# Patient Record
Sex: Male | Born: 1945 | Race: White | Hispanic: No | Smoking: Former smoker
Health system: Southern US, Community
[De-identification: ages and names within clinical notes are randomized; demographics above are authoritative.]

## PROBLEM LIST (undated history)

## (undated) DIAGNOSIS — Z8521 Personal history of malignant neoplasm of larynx: Secondary | ICD-10-CM

## (undated) DIAGNOSIS — C3490 Malignant neoplasm of unspecified part of unspecified bronchus or lung: Secondary | ICD-10-CM

## (undated) DIAGNOSIS — I10 Essential (primary) hypertension: Secondary | ICD-10-CM

## (undated) DIAGNOSIS — C159 Malignant neoplasm of esophagus, unspecified: Secondary | ICD-10-CM

## (undated) DIAGNOSIS — M4800 Spinal stenosis, site unspecified: Secondary | ICD-10-CM

## (undated) HISTORY — PX: TOTAL LARYNGECTOMY: SHX2543

---

## 2012-02-19 DIAGNOSIS — I2510 Atherosclerotic heart disease of native coronary artery without angina pectoris: Secondary | ICD-10-CM

## 2012-03-04 DIAGNOSIS — I2119 ST elevation (STEMI) myocardial infarction involving other coronary artery of inferior wall: Secondary | ICD-10-CM

## 2012-03-04 DIAGNOSIS — I2510 Atherosclerotic heart disease of native coronary artery without angina pectoris: Secondary | ICD-10-CM

## 2012-03-04 HISTORY — PX: LEFT HEART CATH AND CORONARY ANGIOGRAPHY: CATH118249

## 2012-03-04 HISTORY — DX: Atherosclerotic heart disease of native coronary artery without angina pectoris: I25.10

## 2012-03-04 HISTORY — DX: ST elevation (STEMI) myocardial infarction involving other coronary artery of inferior wall: I21.19

## 2012-03-04 HISTORY — PX: CORONARY STENT PLACEMENT: SHX1402

## 2015-02-02 HISTORY — PX: LUNG LOBECTOMY: SHX167

## 2020-01-22 HISTORY — PX: TRANSTHORACIC ECHOCARDIOGRAM: SHX275

## 2020-12-18 ENCOUNTER — Emergency Department (HOSPITAL_COMMUNITY): Payer: Medicare Other

## 2020-12-18 ENCOUNTER — Inpatient Hospital Stay (HOSPITAL_COMMUNITY)
Admission: EM | Admit: 2020-12-18 | Discharge: 2020-12-25 | DRG: 193 | Disposition: A | Discharge status: Home Health | Payer: Medicare Other | Attending: Internal Medicine | Admitting: Internal Medicine

## 2020-12-18 ENCOUNTER — Emergency Department (HOSPITAL_COMMUNITY)
Admission: EM | Admit: 2020-12-18 | Discharge: 2020-12-18 | Discharge status: Absent without leave | Payer: Medicare Other | Source: Home / Self Care | Attending: Emergency Medicine | Admitting: Emergency Medicine

## 2020-12-18 ENCOUNTER — Other Ambulatory Visit: Payer: Self-pay

## 2020-12-18 ENCOUNTER — Encounter (HOSPITAL_COMMUNITY): Payer: Self-pay | Admitting: Student

## 2020-12-18 ENCOUNTER — Inpatient Hospital Stay (HOSPITAL_COMMUNITY): Payer: Medicare Other

## 2020-12-18 DIAGNOSIS — F32A Depression, unspecified: Secondary | ICD-10-CM | POA: Diagnosis present

## 2020-12-18 DIAGNOSIS — M4800 Spinal stenosis, site unspecified: Secondary | ICD-10-CM | POA: Diagnosis present

## 2020-12-18 DIAGNOSIS — Z7189 Other specified counseling: Secondary | ICD-10-CM | POA: Diagnosis not present

## 2020-12-18 DIAGNOSIS — I498 Other specified cardiac arrhythmias: Secondary | ICD-10-CM | POA: Diagnosis present

## 2020-12-18 DIAGNOSIS — K5900 Constipation, unspecified: Secondary | ICD-10-CM | POA: Diagnosis present

## 2020-12-18 DIAGNOSIS — Z7982 Long term (current) use of aspirin: Secondary | ICD-10-CM

## 2020-12-18 DIAGNOSIS — Z85118 Personal history of other malignant neoplasm of bronchus and lung: Secondary | ICD-10-CM

## 2020-12-18 DIAGNOSIS — F0390 Unspecified dementia without behavioral disturbance: Secondary | ICD-10-CM | POA: Diagnosis present

## 2020-12-18 DIAGNOSIS — I2510 Atherosclerotic heart disease of native coronary artery without angina pectoris: Secondary | ICD-10-CM | POA: Diagnosis present

## 2020-12-18 DIAGNOSIS — H57811 Brow ptosis, right: Secondary | ICD-10-CM | POA: Diagnosis present

## 2020-12-18 DIAGNOSIS — R54 Age-related physical debility: Secondary | ICD-10-CM | POA: Diagnosis present

## 2020-12-18 DIAGNOSIS — Z20822 Contact with and (suspected) exposure to covid-19: Secondary | ICD-10-CM | POA: Diagnosis present

## 2020-12-18 DIAGNOSIS — G9341 Metabolic encephalopathy: Secondary | ICD-10-CM | POA: Diagnosis present

## 2020-12-18 DIAGNOSIS — Z79890 Hormone replacement therapy (postmenopausal): Secondary | ICD-10-CM

## 2020-12-18 DIAGNOSIS — Z79891 Long term (current) use of opiate analgesic: Secondary | ICD-10-CM

## 2020-12-18 DIAGNOSIS — L89152 Pressure ulcer of sacral region, stage 2: Secondary | ICD-10-CM | POA: Diagnosis present

## 2020-12-18 DIAGNOSIS — Z79899 Other long term (current) drug therapy: Secondary | ICD-10-CM

## 2020-12-18 DIAGNOSIS — N179 Acute kidney failure, unspecified: Secondary | ICD-10-CM

## 2020-12-18 DIAGNOSIS — Z87891 Personal history of nicotine dependence: Secondary | ICD-10-CM

## 2020-12-18 DIAGNOSIS — J9601 Acute respiratory failure with hypoxia: Secondary | ICD-10-CM | POA: Diagnosis present

## 2020-12-18 DIAGNOSIS — G8929 Other chronic pain: Secondary | ICD-10-CM | POA: Diagnosis present

## 2020-12-18 DIAGNOSIS — Z9002 Acquired absence of larynx: Secondary | ICD-10-CM

## 2020-12-18 DIAGNOSIS — I491 Atrial premature depolarization: Secondary | ICD-10-CM | POA: Diagnosis present

## 2020-12-18 DIAGNOSIS — Z8501 Personal history of malignant neoplasm of esophagus: Secondary | ICD-10-CM

## 2020-12-18 DIAGNOSIS — J189 Pneumonia, unspecified organism: Principal | ICD-10-CM

## 2020-12-18 DIAGNOSIS — E785 Hyperlipidemia, unspecified: Secondary | ICD-10-CM | POA: Diagnosis present

## 2020-12-18 DIAGNOSIS — Z515 Encounter for palliative care: Secondary | ICD-10-CM | POA: Diagnosis not present

## 2020-12-18 DIAGNOSIS — J439 Emphysema, unspecified: Secondary | ICD-10-CM | POA: Diagnosis present

## 2020-12-18 DIAGNOSIS — E860 Dehydration: Secondary | ICD-10-CM | POA: Diagnosis present

## 2020-12-18 DIAGNOSIS — I455 Other specified heart block: Secondary | ICD-10-CM | POA: Diagnosis not present

## 2020-12-18 DIAGNOSIS — I252 Old myocardial infarction: Secondary | ICD-10-CM

## 2020-12-18 DIAGNOSIS — N183 Chronic kidney disease, stage 3 unspecified: Secondary | ICD-10-CM | POA: Diagnosis present

## 2020-12-18 DIAGNOSIS — Z902 Acquired absence of lung [part of]: Secondary | ICD-10-CM

## 2020-12-18 DIAGNOSIS — Z923 Personal history of irradiation: Secondary | ICD-10-CM

## 2020-12-18 DIAGNOSIS — Z888 Allergy status to other drugs, medicaments and biological substances status: Secondary | ICD-10-CM

## 2020-12-18 DIAGNOSIS — Z930 Tracheostomy status: Secondary | ICD-10-CM

## 2020-12-18 DIAGNOSIS — R64 Cachexia: Secondary | ICD-10-CM | POA: Diagnosis present

## 2020-12-18 DIAGNOSIS — Z8521 Personal history of malignant neoplasm of larynx: Secondary | ICD-10-CM

## 2020-12-18 DIAGNOSIS — I5032 Chronic diastolic (congestive) heart failure: Secondary | ICD-10-CM | POA: Diagnosis present

## 2020-12-18 DIAGNOSIS — Z881 Allergy status to other antibiotic agents status: Secondary | ICD-10-CM

## 2020-12-18 DIAGNOSIS — G4700 Insomnia, unspecified: Secondary | ICD-10-CM | POA: Diagnosis present

## 2020-12-18 DIAGNOSIS — I130 Hypertensive heart and chronic kidney disease with heart failure and stage 1 through stage 4 chronic kidney disease, or unspecified chronic kidney disease: Secondary | ICD-10-CM | POA: Diagnosis present

## 2020-12-18 DIAGNOSIS — E440 Moderate protein-calorie malnutrition: Secondary | ICD-10-CM | POA: Diagnosis present

## 2020-12-18 DIAGNOSIS — L8990 Pressure ulcer of unspecified site, unspecified stage: Secondary | ICD-10-CM | POA: Insufficient documentation

## 2020-12-18 DIAGNOSIS — Z880 Allergy status to penicillin: Secondary | ICD-10-CM

## 2020-12-18 DIAGNOSIS — R531 Weakness: Secondary | ICD-10-CM | POA: Diagnosis not present

## 2020-12-18 DIAGNOSIS — Z7401 Bed confinement status: Secondary | ICD-10-CM

## 2020-12-18 DIAGNOSIS — Z6821 Body mass index (BMI) 21.0-21.9, adult: Secondary | ICD-10-CM

## 2020-12-18 DIAGNOSIS — Z955 Presence of coronary angioplasty implant and graft: Secondary | ICD-10-CM

## 2020-12-18 HISTORY — DX: Malignant neoplasm of unspecified part of unspecified bronchus or lung: C34.90

## 2020-12-18 HISTORY — DX: Personal history of malignant neoplasm of larynx: Z85.21

## 2020-12-18 HISTORY — DX: Malignant neoplasm of esophagus, unspecified: C15.9

## 2020-12-18 HISTORY — DX: Spinal stenosis, site unspecified: M48.00

## 2020-12-18 HISTORY — DX: Essential (primary) hypertension: I10

## 2020-12-18 LAB — CBC WITH DIFFERENTIAL/PLATELET
Abs Immature Granulocytes: 1 10*3/uL — ABNORMAL HIGH (ref 0.00–0.07)
Band Neutrophils: 21 %
Basophils Absolute: 0 10*3/uL (ref 0.0–0.1)
Basophils Relative: 0 %
Eosinophils Absolute: 0 10*3/uL (ref 0.0–0.5)
Eosinophils Relative: 0 %
HCT: 35.7 % — ABNORMAL LOW (ref 39.0–52.0)
Hemoglobin: 11.5 g/dL — ABNORMAL LOW (ref 13.0–17.0)
Lymphocytes Relative: 2 %
Lymphs Abs: 0.7 10*3/uL (ref 0.7–4.0)
MCH: 28 pg (ref 26.0–34.0)
MCHC: 32.2 g/dL (ref 30.0–36.0)
MCV: 87.1 fL (ref 80.0–100.0)
Metamyelocytes Relative: 1 %
Monocytes Absolute: 0.7 10*3/uL (ref 0.1–1.0)
Monocytes Relative: 2 %
Myelocytes: 2 %
Neutro Abs: 30.7 10*3/uL — ABNORMAL HIGH (ref 1.7–7.7)
Neutrophils Relative %: 72 %
Platelets: 236 10*3/uL (ref 150–400)
RBC: 4.1 MIL/uL — ABNORMAL LOW (ref 4.22–5.81)
RDW: 15.8 % — ABNORMAL HIGH (ref 11.5–15.5)
WBC Morphology: INCREASED
WBC: 33 10*3/uL — ABNORMAL HIGH (ref 4.0–10.5)
nRBC: 0 % (ref 0.0–0.2)

## 2020-12-18 LAB — COMPREHENSIVE METABOLIC PANEL
ALT: 13 U/L (ref 0–44)
AST: 18 U/L (ref 15–41)
Albumin: 3 g/dL — ABNORMAL LOW (ref 3.5–5.0)
Alkaline Phosphatase: 97 U/L (ref 38–126)
Anion gap: 12 (ref 5–15)
BUN: 64 mg/dL — ABNORMAL HIGH (ref 8–23)
CO2: 24 mmol/L (ref 22–32)
Calcium: 8.4 mg/dL — ABNORMAL LOW (ref 8.9–10.3)
Chloride: 99 mmol/L (ref 98–111)
Creatinine, Ser: 1.65 mg/dL — ABNORMAL HIGH (ref 0.61–1.24)
GFR, Estimated: 43 mL/min — ABNORMAL LOW (ref >60)
Glucose, Bld: 108 mg/dL — ABNORMAL HIGH (ref 70–99)
Potassium: 4.5 mmol/L (ref 3.5–5.1)
Sodium: 135 mmol/L (ref 135–145)
Total Bilirubin: 1.6 mg/dL — ABNORMAL HIGH (ref 0.3–1.2)
Total Protein: 7.6 g/dL (ref 6.5–8.1)

## 2020-12-18 LAB — URINALYSIS, ROUTINE W REFLEX MICROSCOPIC

## 2020-12-18 LAB — CBC
HCT: 34.1 % — ABNORMAL LOW (ref 39.0–52.0)
Hemoglobin: 10.9 g/dL — ABNORMAL LOW (ref 13.0–17.0)
MCH: 28.2 pg (ref 26.0–34.0)
MCHC: 32 g/dL (ref 30.0–36.0)
MCV: 88.3 fL (ref 80.0–100.0)
Platelets: 227 10*3/uL (ref 150–400)
RBC: 3.86 MIL/uL — ABNORMAL LOW (ref 4.22–5.81)
RDW: 15.9 % — ABNORMAL HIGH (ref 11.5–15.5)
WBC: 25.7 10*3/uL — ABNORMAL HIGH (ref 4.0–10.5)
nRBC: 0 % (ref 0.0–0.2)

## 2020-12-18 LAB — PROTIME-INR
INR: 1.3 — ABNORMAL HIGH (ref 0.8–1.2)
Prothrombin Time: 15.4 seconds — ABNORMAL HIGH (ref 11.4–15.2)

## 2020-12-18 LAB — LACTIC ACID, PLASMA
Lactic Acid, Venous: 1.9 mmol/L (ref 0.5–1.9)
Lactic Acid, Venous: 1.7 mmol/L (ref 0.5–1.9)

## 2020-12-18 LAB — RESP PANEL BY RT-PCR (FLU A&B, COVID) ARPGX2
Influenza A by PCR: NEGATIVE
Influenza B by PCR: NEGATIVE
SARS Coronavirus 2 by RT PCR: NEGATIVE

## 2020-12-18 LAB — CREATININE, SERUM
Creatinine, Ser: 1.1 mg/dL (ref 0.61–1.24)
GFR, Estimated: >60 mL/min (ref >60)

## 2020-12-18 LAB — APTT: aPTT: 40 seconds — ABNORMAL HIGH (ref 24–36)

## 2020-12-18 MED ORDER — POLYETHYLENE GLYCOL 3350 17 G PO PACK: 17.0000 g | PACK | Freq: Every day | ORAL | Status: DC | PRN

## 2020-12-18 MED ORDER — ENSURE ENLIVE PO LIQD
237.0000 mL | Freq: Two times a day (BID) | ORAL | Status: DC
  Administered 2020-12-19 – 2020-12-25 (×12): 237 mL via ORAL

## 2020-12-18 MED ORDER — LACTATED RINGERS IV SOLN: INTRAVENOUS | Status: DC

## 2020-12-18 MED ORDER — GABAPENTIN 300 MG PO CAPS
300.0000 mg | ORAL_CAPSULE | Freq: Three times a day (TID) | ORAL | Status: DC
  Administered 2020-12-18 – 2020-12-25 (×22): 300 mg via ORAL
  Filled 2020-12-18 (×22): qty 1

## 2020-12-18 MED ORDER — ONDANSETRON HCL 4 MG PO TABS: 4.0000 mg | ORAL_TABLET | Freq: Four times a day (QID) | ORAL | Status: DC | PRN

## 2020-12-18 MED ORDER — SODIUM CHLORIDE 0.9 % IV SOLN
500.0000 mg | INTRAVENOUS | Status: AC
  Administered 2020-12-18 – 2020-12-24 (×7): 500 mg via INTRAVENOUS
  Filled 2020-12-18 (×3): qty 500
  Filled 2020-12-18: qty 166.67
  Filled 2020-12-18 (×2): qty 500

## 2020-12-18 MED ORDER — SODIUM CHLORIDE 0.9 % IV SOLN
500.0000 mg | INTRAVENOUS | Status: DC
  Filled 2020-12-18: qty 500

## 2020-12-18 MED ORDER — ONDANSETRON HCL 4 MG/2ML IJ SOLN: 4.0000 mg | Freq: Four times a day (QID) | INTRAMUSCULAR | Status: DC | PRN

## 2020-12-18 MED ORDER — SODIUM CHLORIDE 0.9 % IV SOLN
2.0000 g | INTRAVENOUS | Status: AC
  Administered 2020-12-18 – 2020-12-24 (×7): 2 g via INTRAVENOUS
  Filled 2020-12-18 (×5): qty 2
  Filled 2020-12-18: qty 20

## 2020-12-18 MED ORDER — GUAIFENESIN ER 600 MG PO TB12
600.0000 mg | ORAL_TABLET | Freq: Two times a day (BID) | ORAL | Status: DC
  Administered 2020-12-18 – 2020-12-25 (×14): 600 mg via ORAL
  Filled 2020-12-18 (×15): qty 1

## 2020-12-18 MED ORDER — BISACODYL 10 MG RE SUPP: 10.0000 mg | Freq: Every day | RECTAL | Status: DC | PRN

## 2020-12-18 MED ORDER — GABAPENTIN 300 MG PO CAPS
300.0000 mg | ORAL_CAPSULE | Freq: Four times a day (QID) | ORAL | Status: DC
Start: 2020-12-18 — End: 2020-12-18
  Administered 2020-12-18: 300 mg via ORAL
  Filled 2020-12-18: qty 1

## 2020-12-18 MED ORDER — LEVOTHYROXINE SODIUM 88 MCG PO TABS
88.0000 ug | ORAL_TABLET | Freq: Every day | ORAL | Status: DC
  Administered 2020-12-18 – 2020-12-25 (×8): 88 ug via ORAL
  Filled 2020-12-18 (×8): qty 1

## 2020-12-18 MED ORDER — SENNA 8.6 MG PO TABS
1.0000 | ORAL_TABLET | Freq: Two times a day (BID) | ORAL | Status: DC
  Administered 2020-12-18 – 2020-12-25 (×11): 8.6 mg via ORAL
  Filled 2020-12-18 (×14): qty 1

## 2020-12-18 MED ORDER — ASPIRIN EC 81 MG PO TBEC
81.0000 mg | DELAYED_RELEASE_TABLET | Freq: Every day | ORAL | Status: DC
  Administered 2020-12-19 – 2020-12-25 (×7): 81 mg via ORAL
  Filled 2020-12-18 (×7): qty 1

## 2020-12-18 MED ORDER — DOCUSATE SODIUM 100 MG PO CAPS
100.0000 mg | ORAL_CAPSULE | Freq: Two times a day (BID) | ORAL | Status: DC
  Administered 2020-12-19 – 2020-12-25 (×10): 100 mg via ORAL
  Filled 2020-12-18 (×13): qty 1

## 2020-12-18 MED ORDER — LACTATED RINGERS IV BOLUS: 1000.0000 mL | Freq: Once | INTRAVENOUS | Status: DC

## 2020-12-18 MED ORDER — METHADONE HCL 5 MG PO TABS
5.0000 mg | ORAL_TABLET | Freq: Four times a day (QID) | ORAL | Status: DC
  Administered 2020-12-18 – 2020-12-25 (×28): 5 mg via ORAL
  Filled 2020-12-18 (×30): qty 1

## 2020-12-18 MED ORDER — SODIUM CHLORIDE 0.9 % IV SOLN
2.0000 g | INTRAVENOUS | Status: DC
  Filled 2020-12-18: qty 20

## 2020-12-18 MED ORDER — VITAMIN B-12 1000 MCG PO TABS
1000.0000 ug | ORAL_TABLET | Freq: Every day | ORAL | Status: DC
  Administered 2020-12-19 – 2020-12-25 (×7): 1000 ug via ORAL
  Filled 2020-12-18 (×7): qty 1

## 2020-12-18 MED ORDER — ALBUTEROL SULFATE (2.5 MG/3ML) 0.083% IN NEBU: 2.5000 mg | INHALATION_SOLUTION | Freq: Four times a day (QID) | RESPIRATORY_TRACT | Status: DC | PRN

## 2020-12-18 MED ORDER — ENOXAPARIN SODIUM 40 MG/0.4ML ~~LOC~~ SOLN
40.0000 mg | SUBCUTANEOUS | Status: DC
  Administered 2020-12-18 – 2020-12-24 (×7): 40 mg via SUBCUTANEOUS
  Filled 2020-12-18 (×7): qty 0.4

## 2020-12-18 MED ORDER — ACETAMINOPHEN 325 MG PO TABS
650.0000 mg | ORAL_TABLET | Freq: Four times a day (QID) | ORAL | Status: DC | PRN
  Administered 2020-12-19 – 2020-12-25 (×10): 650 mg via ORAL
  Filled 2020-12-18 (×11): qty 2

## 2020-12-18 MED ORDER — DULOXETINE HCL 20 MG PO CPEP
20.0000 mg | ORAL_CAPSULE | Freq: Every day | ORAL | Status: DC
  Administered 2020-12-19 – 2020-12-25 (×7): 20 mg via ORAL
  Filled 2020-12-18 (×7): qty 1

## 2020-12-18 MED ORDER — LACTATED RINGERS IV SOLN: INTRAVENOUS | Status: AC

## 2020-12-18 MED ORDER — ACETAMINOPHEN 650 MG RE SUPP: 650.0000 mg | Freq: Four times a day (QID) | RECTAL | Status: DC | PRN

## 2020-12-18 MED ORDER — LACTATED RINGERS IV BOLUS
1000.0000 mL | Freq: Once | INTRAVENOUS | Status: AC
  Administered 2020-12-18: 1000 mL via INTRAVENOUS

## 2020-12-18 NOTE — ED Triage Notes (Addendum)
Pt arrives EMS from home with suspected urinary tract infection and shob. Pt oxygen dropped to 88% ra. EMS placed NRB 15L on stoma. Hx lung cancer.

## 2020-12-18 NOTE — ED Triage Notes (Signed)
Pt arrives EMS from home with suspected urinary tract infection and shob. Pt oxygen dropped to 88% ra. EMS placed NRB 15L on stoma. Hx lung cancer.

## 2020-12-18 NOTE — ED Notes (Signed)
Patient attached to external condom catheter

## 2020-12-18 NOTE — ED Provider Notes (Signed)
[facility] DEPT Provider Note   CSN: [id number] Arrival date & time: 12/18/20  0428     History Chief Complaint  Patient presents with  . Fall  . Weakness    Ryan Miles is a 75 y.o. male.with a hx of hypertension, spinal stenosis, CAD, lung cancer & esophageal cancer who presents to the ED via EMS for evaluation of fall tonight and concern for UTI.  History initially provided by EMS who relayed that they received a call that the patient had a fall from his bed and there was concern for UTI, EMS did not believe there was any injury from fall, they also note that patient was desaturating en route, he is noted to be bed bound.  Ultimately patient's daughter arrived shortly following his arrival-she states that over the past few days he is seemed more confused intermittently as well as had increased weakness especially to the upper extremities bilaterally.  He is requesting to urinate frequently but then does not need to urinate.  An occupational therapist that came to the house told him his symptoms may be from a UTI.  Tonight patient's daughter heard a loud thump noise, she found the patient on the ground, she thinks he may have hit his head but does not think that he had loss of consciousness.  Patient intermittently more confused per family, having some difficulty with communication as he has significant hoarseness S/p esophageal cancer, level 5 caveat applies. Patient had prior electrolarynx in place however this was removed.   HPI     Past Medical History:  Diagnosis Date  . Esophageal cancer ([place])   . Hypertension   . Lung cancer ([place])   . Spinal stenosis     There are no problems to display for this patient.   History reviewed. No pertinent surgical history.     History reviewed. No pertinent family history.     Home Medications Prior to Admission medications   Not on File    Allergies    Patient has no allergy information on  record.  Review of Systems   Review of Systems Review of Systems  Unable to perform ROS: Mental status change (& significant hoarseness limiting communication)   Physical Exam Updated Vital Signs BP (!) 113/58 (BP Location: Right Arm)   Pulse 92   Temp 98.2 F (36.8 C) (Oral)   Resp 18   SpO2 96%   Physical Exam Vitals and nursing note reviewed. Exam conducted with a chaperone present.  Constitutional:      Comments: Chronically ill appearing.   HENT:     Head: Normocephalic and atraumatic.     Comments: No racoon eyes or battle sign.     Mouth/Throat:     Mouth: Mucous membranes are dry.  Eyes:     Pupils: Pupils are equal, round, and reactive to light.  Neck:     Comments: Stoma present with some yellow secretions externally.  Cardiovascular:     Rate and Rhythm: Normal rate and regular rhythm.  Pulmonary:     Effort: No respiratory distress.     Comments: SpO2 85-95% on RA, supplemental O2 applied with improvement. Course breath sounds at the bases- R>L.  Abdominal:     General: There is no distension.     Palpations: Abdomen is soft.     Tenderness: There is no abdominal tenderness. There is no guarding or rebound.  Genitourinary:    Comments: sacral ulcer present, no significant skin break down,  mild Soft brown stool present. No melena.  Musculoskeletal:     Cervical back: Neck supple. No tenderness.     Comments: No obvious deformities or significant open wounds.  Passively ranged & palpated throughout the bilateral upper and lower extremities without areas of elicited discomfort. Diffuse midline spinal tenderness to the thoracic/lumbar region without palpable step off.   Skin:    General: Skin is warm and dry.     Comments: Warm to the touch.   Neurological:     Mental Status: He is alert.     Comments: Alert.     ED Results / Procedures / Treatments   Labs (all labs ordered are listed, but only abnormal results are displayed) Labs Reviewed  URINALYSIS,  ROUTINE W REFLEX MICROSCOPIC - Abnormal; Notable for the following components:      Result Value   Color, Urine AMBER (*)    Bilirubin Urine SMALL (*)    Ketones, ur 5 (*)    All other components within normal limits  COMPREHENSIVE METABOLIC PANEL - Abnormal; Notable for the following components:   Glucose, Bld 101 (*)    BUN 63 (*)    Creatinine, Ser 1.50 (*)    Calcium 8.5 (*)    Albumin 3.1 (*)    Total Bilirubin 1.5 (*)    GFR, Estimated 49 (*)    All other components within normal limits  CBC WITH DIFFERENTIAL/PLATELET - Abnormal; Notable for the following components:   WBC 31.9 (*)    RBC 4.17 (*)    Hemoglobin 11.8 (*)    HCT 36.4 (*)    RDW 15.7 (*)    All other components within normal limits  RESP PANEL BY RT-PCR (FLU A&B, COVID) [id number]  URINE CULTURE  CULTURE, BLOOD (ROUTINE X 2)  CULTURE, BLOOD (ROUTINE X 2)  LACTIC ACID, PLASMA  LACTIC ACID, PLASMA  PROTIME-INR  APTT    EKG EKG Interpretation  Date/Time:  Thursday December 18 2020 04:51:15 EDT Ventricular Rate:  85 PR Interval:    QRS Duration: 114 QT Interval:  389 QTC Calculation: 463 R Axis:   79 Text Interpretation: Sinus rhythm Incomplete left bundle branch block No prior ecg for comparison Confirmed by Veryl Speak [phone]) on 12/18/2020 5:22:22 AM   Radiology DG Chest 2 View  Result Date: 12/18/2020 CLINICAL DATA:  Dyspnea.  Fall. EXAM: CHEST - 2 VIEW COMPARISON:  12/13/2014 FINDINGS: Opacity at both lung bases. No Kerley lines, effusion, or pneumothorax seen. Normal heart size and aortic contours. Surgical clips overlap the thoracic inlet. IMPRESSION: Bilateral lower lobe infiltrate. Electronically Signed   By: Monte Fantasia M.D.   On: 12/18/2020 06:19   DG Thoracic Spine 2 View  Result Date: 12/18/2020 CLINICAL DATA:  Dyspnea and fall EXAM: THORACIC SPINE 2 VIEWS COMPARISON:  None. FINDINGS: No evidence of fracture or bone lesion. Mid and lower thoracic disc narrowing and spurring with 2 non  segmented or ankylosed midthoracic vertebrae. No evident bone lesion. Lower lobe infiltrates described on dedicated chest x-ray. IMPRESSION: No acute finding. Electronically Signed   By: Monte Fantasia M.D.   On: 12/18/2020 06:21   DG Lumbar Spine Complete  Result Date: 12/18/2020 CLINICAL DATA:  Fall with dyspnea EXAM: LUMBAR SPINE - COMPLETE 4+ VIEW COMPARISON:  03/14/2017 FINDINGS: Five lumbar type vertebrae Advanced mid and lower lumbar disc degeneration with disc collapse and ridging that is progressed. Degenerative facet spurring in the lower lumbar spine. Mild L3-4 retrolisthesis, also seen on prior. Slight dextroscoliosis. No  overt bone lesion or erosion. Prominent gas within colon and stool at the rectum. Atherosclerosis. IMPRESSION: 1. No acute finding. 2. Advanced lower lumbar disc degeneration that has progressed from 2018. 3. Prominent colonic gas and rectal stool. Electronically Signed   By: Monte Fantasia M.D.   On: 12/18/2020 06:23   DG Pelvis 1-2 Views  Result Date: 12/18/2020 CLINICAL DATA:  Fall with upper and lower back pain. EXAM: PELVIS - 1-2 VIEW COMPARISON:  None. FINDINGS: Rotated pelvis with no evidence of fracture or subluxation. Lower lumbar spine degeneration. Prominent rectosigmoid stool with 10 cm transverse rectal span. The cecum is gas distended to 11 cm. IMPRESSION: 1. No acute finding in the pelvis. 2. Rectosigmoid distention by stool. Electronically Signed   By: Monte Fantasia M.D.   On: 12/18/2020 06:24   CT Head Wo Contrast  Result Date: 12/18/2020 CLINICAL DATA:  Fall with head trauma EXAM: CT HEAD WITHOUT CONTRAST CT CERVICAL SPINE WITHOUT CONTRAST TECHNIQUE: Multidetector CT imaging of the head and cervical spine was performed following the standard protocol without intravenous contrast. Multiplanar CT image reconstructions of the cervical spine were also generated. COMPARISON:  None. FINDINGS: CT HEAD FINDINGS Brain: No evidence of acute infarction,  hemorrhage, hydrocephalus, extra-axial collection or mass lesion/mass effect. Generalized atrophy Vascular: No hyperdense vessel or unexpected calcification. Skull: Normal. Negative for fracture or focal lesion. Sinuses/Orbits: Negative CT CERVICAL SPINE FINDINGS Alignment: Dextroscoliosis. Skull base and vertebrae: No acute fracture Soft tissues and spinal canal: No prevertebral fluid or swelling. No visible canal hematoma. Laryngectomy and neck dissection with transesophageal puncture. Disc levels: Severe facet osteoarthritis with asymmetric bulky spurring on the left. Generalized disc degeneration focally advanced at C6-7. Upper chest: Negative IMPRESSION: No evidence of acute intracranial or cervical spine injury. Electronically Signed   By: Monte Fantasia M.D.   On: 12/18/2020 06:45   CT Cervical Spine Wo Contrast  Result Date: 12/18/2020 CLINICAL DATA:  Fall with head trauma EXAM: CT HEAD WITHOUT CONTRAST CT CERVICAL SPINE WITHOUT CONTRAST TECHNIQUE: Multidetector CT imaging of the head and cervical spine was performed following the standard protocol without intravenous contrast. Multiplanar CT image reconstructions of the cervical spine were also generated. COMPARISON:  None. FINDINGS: CT HEAD FINDINGS Brain: No evidence of acute infarction, hemorrhage, hydrocephalus, extra-axial collection or mass lesion/mass effect. Generalized atrophy Vascular: No hyperdense vessel or unexpected calcification. Skull: Normal. Negative for fracture or focal lesion. Sinuses/Orbits: Negative CT CERVICAL SPINE FINDINGS Alignment: Dextroscoliosis. Skull base and vertebrae: No acute fracture Soft tissues and spinal canal: No prevertebral fluid or swelling. No visible canal hematoma. Laryngectomy and neck dissection with transesophageal puncture. Disc levels: Severe facet osteoarthritis with asymmetric bulky spurring on the left. Generalized disc degeneration focally advanced at C6-7. Upper chest: Negative IMPRESSION: No  evidence of acute intracranial or cervical spine injury. Electronically Signed   By: Monte Fantasia M.D.   On: 12/18/2020 06:45   Procedures .Critical Care Performed by: Amaryllis Dyke, PA-C Authorized by: Amaryllis Dyke, PA-C    CRITICAL CARE Performed by: Kennith Maes   Total critical care time: 35 minutes  Critical care time was exclusive of separately billable procedures and treating other patients.  Critical care was necessary to treat or prevent imminent or life-threatening deterioration.  Critical care was time spent personally by me on the following activities: development of treatment plan with patient and/or surrogate as well as nursing, discussions with consultants, evaluation of patient's response to treatment, examination of patient, obtaining history from patient or surrogate, ordering  and performing treatments and interventions, ordering and review of laboratory studies, ordering and review of radiographic studies, pulse oximetry and re-evaluation of patient's condition.    Medications Ordered in ED Medications  lactated ringers infusion (has no administration in time range)  lactated ringers bolus 1,000 mL (has no administration in time range)  cefTRIAXone (ROCEPHIN) 2 g in sodium chloride 0.9 % 100 mL IVPB (has no administration in time range)  azithromycin (ZITHROMAX) 500 mg in sodium chloride 0.9 % 250 mL IVPB (has no administration in time range)    ED Course  I have reviewed the triage vital signs and the nursing notes.  Pertinent labs & imaging results that were available during my care of the patient were reviewed by me and considered in my medical decision making (see chart for details).    MDM Rules/Calculators/A&P                          Upon patient arrival incorrect information was placed into the system, initial work-up and orders were placed under the initial inputted information, care was initiated @ 4:28AM upon patient's  initial ED arrival- above has been imported from patient's initial work-up & findings, orders had to be re-placed due to system error.   Patient presents to the ED S/p fall from bed with concern for UTI as well.  Rectal temp 100.0, hypoxic on RA, otherwise vitals fairly unremarkable.    Additional history obtained:  Additional history obtained from chart review & nursing note review.   EKG: Sinus rhythm Incomplete left bundle branch block No prior ecg for comparison  Lab Tests:  I Ordered, reviewed, and interpreted labs, which included:  Urinalysis: No obvious UTI. CBC: Significant leukocytosis at almost 32,000, mild anemia.  CMP: Acute kidney injury with BUN 63 and creatinine 1.5.  No significant electrolyte derangement. COVID/influenza: Negative  With patient's leukocytosis at almost 32,000 and rectal temp of 100.0 increasing concern for sepsis, code sepsis initiated, pulmonary antibiotic coverage ordered as chest x-ray looks concerning for pneumonia on my review, will give 1 L of LR with close monitoring of blood pressure and with lactic acid pending to determine if patient requires full 30 cc/kg bolus.  Imaging Studies ordered:  I ordered imaging studies which included x-rays of the chest, pelvis, thoracic spine, and lumbar spine as well as CT of the head and cervical spine, I independently reviewed, formal radiology impression shows:   CXR: Bilateral lower lobe infiltrate T spine:  No acute finding L spine: 1. No acute finding. 2. Advanced lower lumbar disc degeneration that has progressed from 2018. 3. Prominent colonic gas and rectal stool Pelvis:  1. No acute finding in the pelvis. 2. Rectosigmoid distention by stool  CT head/Cspine: No evidence of acute intracranial or cervical spine injury.  Patient with bilateral lower lobe pneumonia with new oxygen requirement and acute kidney injury also with some concern for sepsis will consult hospitalist service for admission.  07:18:  CONSULT: Discussed with hospitalist Dr. Maurine Minister accept admission.   Findings and plan of care discussed with supervising physician Dr. Stark Jock who has evaluated patient & is in agreement.   Portions of this note were generated with L[employer]. Dictation errors may occur despite best attempts at proofreading.  Final Clinical Impression(s) / ED Diagnoses Final diagnoses:  Acute hypoxemic respiratory failure ([place])  Pneumonia of both lower lobes due to infectious organism  AKI (acute kidney injury) ([place])    Rx /  DC Orders ED Discharge Orders    None       Leafy Kindle 12/18/20 2132    Veryl Speak, MD 12/19/20 [phone]

## 2020-12-18 NOTE — Sepsis Progress Note (Signed)
eLink is monitoring this Code Sepsis. °

## 2020-12-18 NOTE — ED Notes (Addendum)
Wrong patient arrived.  Pt nonverbal and cannot confirm birth date.  No ID sent w/ patient.     SZP entered to get charts corrected.

## 2020-12-18 NOTE — Progress Notes (Signed)
Visitation policy explained to visitor, who stated understanding. Visitor escorted to ER exit by hospital staff.

## 2020-12-18 NOTE — H&P (Addendum)
History and Physical    Ryan Miles ZDG:[id number] DOB: 1946/08/14 DOA: 12/18/2020  PCP: [name], MD  Patient coming from: Home  Chief Complaint: AMS  HPI: Ryan Miles is a 75 y.o. male with medical history significant of esophageal CA, lung CA, HTN, hypothyroidism. Presenting with altered mental status. History is from daughter at bedside as patient has difficulty with long conversation d/t his compensation for esophageal CA. The dtr reports that over the last few days the patient has intermittent episodes of confusion and increasing weakness. She states that he seem to speak about nonsensical things at times and is unable to answer simple questions. She has noted that he was supposed to have an OT visit, but was having a generalized weakness that was concerning. The therapist told her that it is possible that he has a UTI that it causing all of this. She was going to take the patient to his PCP to evaluate him today. However, early this morning, he fell out of bed. She became concerned and came to the ED. She denies any other aggravating or alleviating factors.    ED Course: He was found to be hypoxic on RA. He was found to have b/l PNA and AKI. He was given fluids, started on CAP coverage, and started on supplemental O2. TRH was called for admission.   OF NOTE: Lab work and imaging was incorrectly placed in another chart. For full labwork/imaging that has not crossed over; review EDP note.    Review of Systems:  Review of systems is otherwise negative for all not mentioned in HPI.   PMHx Past Medical History:  Diagnosis Date  . Esophageal cancer ([place])   . Hypertension   . Lung cancer ([place])   . Spinal stenosis     PSHx tracheostomy  SocHx Denies current tobacco, EtOH, illicit Rx use  Allergies  Allergen Reactions  . Rosuvastatin     Other reaction(s): Headache, Other (See Comments) Entire body aches.  . Atorvastatin     Other reaction(s): Muscle Pain  .  Cephalexin     Other reaction(s): Other (See Comments) "fuzzy mouth"  . Penicillins     Other reaction(s): Unknown    FamHx History reviewed. No pertinent family history.  Prior to Admission medications   Medication Sig Start Date End Date Taking? Authorizing Provider  aspirin 81 MG EC tablet Take 81 mg by mouth daily. 06/09/12  Yes [provider]  cyanocobalamin 1000 MCG tablet Take 1,000 mcg by mouth daily. 06/20/20  Yes [provider]  DULoxetine (CYMBALTA) 20 MG capsule Take 20 mg by mouth daily. 11/10/20  Yes [provider]  feeding supplement (ENSURE ENLIVE / ENSURE PLUS) LIQD Take 237 mLs by mouth 2 (two) times daily between meals.   Yes [provider]  gabapentin (NEURONTIN) 300 MG capsule Take 300 mg by mouth 4 (four) times daily. 11/10/20  Yes [provider]  levothyroxine (SYNTHROID) 88 MCG tablet Take 88 mcg by mouth daily. 06/20/20  Yes [provider]  methadone (DOLOPHINE) 5 MG tablet Take 5 mg by mouth 4 (four) times daily. 11/28/20  Yes [provider]  metoprolol tartrate (LOPRESSOR) 25 MG tablet Take 25 mg by mouth 2 (two) times daily. 12/24/19  Yes [provider]  ramipril (ALTACE) 5 MG capsule Take 5 mg by mouth daily. 04/02/20  Yes [provider]  senna (SENOKOT) 8.6 MG tablet Take 3-4 tablets by mouth at bedtime.   Yes [provider]  Physical Exam: Vitals:   12/18/20 0648 12/18/20 0833 12/18/20 0847 12/18/20 0912  BP: (!) 113/58 125/64    Pulse: 92 (!) 112    Resp: 18 20    Temp: 100 F (37.8 C) 98.3 F (36.8 C)    TempSrc: Oral Oral    SpO2: 96% 98%    Weight:    62.1 kg  Height:   5\' 7"  (1.702 m) 5\' 7"  (1.702 m)    General: 75 y.o. male resting in bed in NAD Eyes: PERRL, normal sclera ENMT: Nares patent w/o discharge, orophaynx clear, dentition normal, ears w/o discharge/lesions/ulcers Neck: Supple, trachea midline Cardiovascular: RRR, +S1, S2, no m/g/r, equal  pulses throughout Respiratory: decreased at bases w/ some rhonci, stoma , no w/r, normal WOB on supplemental O2 GI: BS+, NDNT, no masses noted, no organomegaly noted MSK: No e/c/c Skin: No rashes, bruises, ulcerations noted Neuro: A&O x 3, no focal deficits Psyc: Appropriate interaction and affect, calm/cooperative  Labs on Admission: I have personally reviewed following labs and imaging studies  CBC: Recent Labs  Lab 12/18/20 0708  WBC 33.0*  NEUTROABS 30.7*  HGB 11.5*  HCT 35.7*  MCV 87.1  PLT 283   Basic Metabolic Panel: Recent Labs  Lab 12/18/20 0708  NA 135  K 4.5  CL 99  CO2 24  GLUCOSE 108*  BUN 64*  CREATININE 1.65*  CALCIUM 8.4*   GFR: Estimated Creatinine Clearance: 33.5 mL/min (A) (by C-G formula based on SCr of 1.65 mg/dL (H)). Liver Function Tests: Recent Labs  Lab 12/18/20 0708  AST 18  ALT 13  ALKPHOS 97  BILITOT 1.6*  PROT 7.6  ALBUMIN 3.0*   No results for input(s): LIPASE, AMYLASE in the last 168 hours. No results for input(s): AMMONIA in the last 168 hours. Coagulation Profile: Recent Labs  Lab 12/18/20 0708  INR 1.3*   Cardiac Enzymes: No results for input(s): CKTOTAL, CKMB, CKMBINDEX, TROPONINI in the last 168 hours. BNP (last 3 results) No results for input(s): PROBNP in the last 8760 hours. HbA1C: No results for input(s): HGBA1C in the last 72 hours. CBG: No results for input(s): GLUCAP in the last 168 hours. Lipid Profile: No results for input(s): CHOL, HDL, LDLCALC, TRIG, CHOLHDL, LDLDIRECT in the last 72 hours. Thyroid Function Tests: No results for input(s): TSH, T4TOTAL, FREET4, T3FREE, THYROIDAB in the last 72 hours. Anemia Panel: No results for input(s): VITAMINB12, FOLATE, FERRITIN, TIBC, IRON, RETICCTPCT in the last 72 hours. Urine analysis: No results found for: COLORURINE, APPEARANCEUR, LABSPEC, [place], GLUCOSEU, HGBUR, BILIRUBINUR, KETONESUR, PROTEINUR, UROBILINOGEN, NITRITE, LEUKOCYTESUR  Radiological Exams  on Admission: DG Chest Port 1 View  Result Date: 12/18/2020 CLINICAL DATA:  Questionable sepsis EXAM: PORTABLE CHEST 1 VIEW COMPARISON:  None. FINDINGS: Infiltrates at the lung bases with small left pleural effusion. Normal heart size and mediastinal contours. Lucent appearance of the upper lungs but no generalized hyperinflation. Postoperative neck. IMPRESSION: Left more than right lower lobe infiltrates. Electronically Signed   By: Monte Fantasia M.D.   On: 12/18/2020 07:09   Assessment/Plan B/L Lower Lobe PNA Acute hypoxic respiratory failure Acute metabolic encephalopathy     - admit to inpt, tele     - continue rocephin, zithro     - add albuterol, mucinex     - check urine legionella, strep     - wean O2 as able     - as far has his mentation is concerned, he is able to appropriately answer questions with me despite communication  difficulty and is following commands     - CTH/c-spine we negative; follow for now  AKI     - check renal US     - continue fluids     - watch nephrotoxins  HTN     - normotensive now; hold BP meds for now  Constipation Chronic pain med use     - continue home regimen     - add colace, miralax     - check KUB  Hypothyroidism     - continue synthroid  DVT prophylaxis: lovenox  Code Status: FULL  Family Communication: with dtr at bedside  Consults called: None   Status is: Inpatient  Remains inpatient appropriate because:Inpatient level of care appropriate due to severity of illness   Dispo: The patient is from: Home              Anticipated d/c is to: Home              Patient currently is not medically stable to d/c.   Difficult to place patient No  Jonnie Finner DO [facility]  If 7PM-7AM, please contact night-coverage [email or web address]  12/18/2020, 9:15 AM

## 2020-12-19 ENCOUNTER — Encounter (HOSPITAL_COMMUNITY): Payer: Self-pay | Admitting: Internal Medicine

## 2020-12-19 DIAGNOSIS — I491 Atrial premature depolarization: Secondary | ICD-10-CM

## 2020-12-19 DIAGNOSIS — I498 Other specified cardiac arrhythmias: Secondary | ICD-10-CM | POA: Clinically undetermined

## 2020-12-19 DIAGNOSIS — N179 Acute kidney failure, unspecified: Secondary | ICD-10-CM

## 2020-12-19 DIAGNOSIS — I455 Other specified heart block: Secondary | ICD-10-CM

## 2020-12-19 DIAGNOSIS — L8990 Pressure ulcer of unspecified site, unspecified stage: Secondary | ICD-10-CM | POA: Insufficient documentation

## 2020-12-19 LAB — C DIFFICILE QUICK SCREEN W PCR REFLEX
C Diff antigen: NEGATIVE
C Diff interpretation: NOT DETECTED
C Diff toxin: NEGATIVE

## 2020-12-19 LAB — CBC
HCT: 29.6 % — ABNORMAL LOW (ref 39.0–52.0)
Hemoglobin: 9.5 g/dL — ABNORMAL LOW (ref 13.0–17.0)
MCH: 28.5 pg (ref 26.0–34.0)
MCHC: 32.1 g/dL (ref 30.0–36.0)
MCV: 88.9 fL (ref 80.0–100.0)
Platelets: 200 10*3/uL (ref 150–400)
RBC: 3.33 MIL/uL — ABNORMAL LOW (ref 4.22–5.81)
RDW: 15.9 % — ABNORMAL HIGH (ref 11.5–15.5)
WBC: 19.7 10*3/uL — ABNORMAL HIGH (ref 4.0–10.5)
nRBC: 0 % (ref 0.0–0.2)

## 2020-12-19 LAB — COMPREHENSIVE METABOLIC PANEL
ALT: 13 U/L (ref 0–44)
AST: 16 U/L (ref 15–41)
Albumin: 2.2 g/dL — ABNORMAL LOW (ref 3.5–5.0)
Alkaline Phosphatase: 79 U/L (ref 38–126)
Anion gap: 10 (ref 5–15)
BUN: 48 mg/dL — ABNORMAL HIGH (ref 8–23)
CO2: 22 mmol/L (ref 22–32)
Calcium: 7.8 mg/dL — ABNORMAL LOW (ref 8.9–10.3)
Chloride: 106 mmol/L (ref 98–111)
Creatinine, Ser: 1.04 mg/dL (ref 0.61–1.24)
GFR, Estimated: >60 mL/min (ref >60)
Glucose, Bld: 102 mg/dL — ABNORMAL HIGH (ref 70–99)
Potassium: 3.9 mmol/L (ref 3.5–5.1)
Sodium: 138 mmol/L (ref 135–145)
Total Bilirubin: 1 mg/dL (ref 0.3–1.2)
Total Protein: 5.9 g/dL — ABNORMAL LOW (ref 6.5–8.1)

## 2020-12-19 LAB — STREP PNEUMONIAE URINARY ANTIGEN: Strep Pneumo Urinary Antigen: NEGATIVE

## 2020-12-19 MED ORDER — METOPROLOL TARTRATE 5 MG/5ML IV SOLN
2.5000 mg | Freq: Once | INTRAVENOUS | Status: AC
  Administered 2020-12-19: 2.5 mg via INTRAVENOUS
  Filled 2020-12-19: qty 5

## 2020-12-19 MED ORDER — KETOROLAC TROMETHAMINE 15 MG/ML IJ SOLN
15.0000 mg | Freq: Once | INTRAMUSCULAR | Status: AC
  Administered 2020-12-19: 15 mg via INTRAVENOUS
  Filled 2020-12-19: qty 1

## 2020-12-19 MED ORDER — SODIUM CHLORIDE 0.9 % IV BOLUS
500.0000 mL | Freq: Once | INTRAVENOUS | Status: AC
  Administered 2020-12-19: 500 mL via INTRAVENOUS

## 2020-12-19 NOTE — Progress Notes (Signed)
12 Lead EKG ordered by Attending - DR. Lama. Completed, printed, and placed in pt's chart. EKG did not transmit to results review. MD notified.   NB    Do not remove EKG from chart  Cardiologist will review!!!

## 2020-12-19 NOTE — Progress Notes (Signed)
Paged hospitalist on call about patient's current complaint of pain. Patient isn't verbal, but mouths and indicates by gesturing that pain is 10/10 in his back.

## 2020-12-19 NOTE — Progress Notes (Signed)
Hospitalist on call notified of patient's current afib with brief RVR. According to tele, rate increased to 150's briefly. HR in 120's otherwise.

## 2020-12-19 NOTE — Consult Note (Signed)
Cardiology Consultation:   Patient ID: Ryan Miles MRN: [id number]; DOB: 07/26/1946  Admit date: 12/18/2020 Date of Consult: 12/19/2020  PCP:  [name], MD   Cardiologist:  No primary care provider on file. Cloretta Ned, MD ([facility])  Patient Profile:   Ryan Miles is a 75 y.o. 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  Ryan Miles is being seen today for the evaluation of Abnormal EKG/Telemetry (sometimes going slow and sometimes going fast) at the request of Darrick Meigs, Marge Duncans, MD.  History of Present Illness:   Ryan Miles is a patient followed by [facility] cardiology with a history of inferior STEMI back in 2013 (DES x2 to the RCA) along with laryngeal carcinoma status post XRT laryngectomy.  He has been relatively stable from a cardiac standpoint-last seen by his cardiologist in August 2021.  Last echo reviewed below essentially normal.  He has no history of any arrhythmias.  According to his primary cardiologist note, he has been walking, doing well, cutting firewood.  He was admitted on 17 March with altered mental status, confusion and increased weakness.  Speaking nonsensical.  There was concern for possible UTI, therefore he was brought to the ER.  He was found to be hypoxic on room air on arrival, and chest x-ray suggested bilateral pneumonia with acute kidney injury.  He is started on IV fluids, and supplemental oxygen along with antibiotics=>   He was rounded on today by Dr. Darrick Meigs.  Was still on blow-by oxygen across his laryngostomy site.  Was continued on Rocephin and Zithromax.  Apparently several times during the course today the telemetry has shown potential tachycardia and pauses.  The patient is completely  asymptomatic when it comes to any irregular heartbeat/palpitations.  He denies any chest pain or pressure.  No loss of consciousness or lightheadedness besides just being short of breath.  Not really coughing a lot now.  Past Medical History:  Diagnosis Date  . Coronary artery disease involving native coronary artery 03/2012   -100% RCA occluded-DES x2 [facility])  . 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 Hypertension   . Lung cancer ([place])   . Spinal stenosis   . ST elevation myocardial infarction (STEMI) of inferior wall ([place]) 03/2012   ([place]) 100% RCA-DES PCI x2 to RCA    Past Surgical History:  Procedure Laterality Date  . CORONARY STENT PLACEMENT  03/2012   [facility]: DES x2 to RCA.  Marland Kitchen LEFT HEART CATH AND CORONARY ANGIOGRAPHY  03/2012   ([facility])-inferior STEMI, 100% RCA.  Marland Kitchen LUNG LOBECTOMY Right 02/2015   Right lower lobe ([place])  . TOTAL LARYNGECTOMY    . TRANSTHORACIC ECHOCARDIOGRAM  01/22/2020   EF estimated greater than 55%.  Mild LV thickening.  Normal RV function.  Moderate MR.  Otherwise trivial AI, PRN TR.  Otherwise normal     Home Medications:  Prior to Admission medications   Medication Sig Start Date End Date Taking? Authorizing Provider  aspirin 81 MG EC tablet Take 81 mg by mouth daily. 06/09/12  Yes [provider]  cyanocobalamin 1000 MCG tablet Take 1,000 mcg by mouth daily. 06/20/20  Yes [provider]  DULoxetine (CYMBALTA) 20  MG capsule Take 20 mg by mouth daily. 11/10/20  Yes [provider]  feeding supplement (ENSURE ENLIVE / ENSURE PLUS) LIQD Take 237 mLs by mouth 2 (two) times daily between meals.   Yes [provider]  gabapentin (NEURONTIN) 300 MG capsule Take 300 mg by mouth 4 (four) times daily. 11/10/20  Yes [provider]  levothyroxine (SYNTHROID) 88 MCG tablet Take 88 mcg by mouth daily. 06/20/20  Yes [provider]  methadone (DOLOPHINE) 5 MG tablet Take 5  mg by mouth 4 (four) times daily. 11/28/20  Yes [provider]  metoprolol tartrate (LOPRESSOR) 25 MG tablet Take 25 mg by mouth 2 (two) times daily. 12/24/19  Yes [provider]  ramipril (ALTACE) 5 MG capsule Take 5 mg by mouth daily. 04/02/20  Yes [provider]  senna (SENOKOT) 8.6 MG tablet Take 3-4 tablets by mouth at bedtime.   Yes [provider]    Inpatient Medications: Scheduled Meds: . aspirin EC  81 mg Oral Daily  . docusate sodium  100 mg Oral BID  . DULoxetine  20 mg Oral Daily  . enoxaparin (LOVENOX) injection  40 mg Subcutaneous Q24H  . feeding supplement  237 mL Oral BID BM  . gabapentin  300 mg Oral TID  . guaiFENesin  600 mg Oral BID  . levothyroxine  88 mcg Oral Daily  . methadone  5 mg Oral QID  . senna  1 tablet Oral BID  . cyanocobalamin  1,000 mcg Oral Daily   Continuous Infusions: . azithromycin 500 mg (12/19/20 0958)  . cefTRIAXone (ROCEPHIN)  IV 2 g (12/19/20 0830)   PRN Meds: acetaminophen **OR** acetaminophen, albuterol, bisacodyl, ondansetron **OR** ondansetron (ZOFRAN) IV, polyethylene glycol  Allergies:    Allergies  Allergen Reactions  . Rosuvastatin     Other reaction(s): Headache, Other (See Comments) Entire body aches.  . Atorvastatin     Other reaction(s): Muscle Pain  . Cephalexin     Other reaction(s): Other (See Comments) "fuzzy mouth"  . Penicillins     Other reaction(s): Unknown    Social History:   Tobacco Use Types Packs/Day Years Used Date  Former Smoker Cigarettes 2 8 Quit: 10/04/1984  Smokeless Tobacco: Former Systems developer   Quit: 03/26/2012   Comments: 1 dip per week   Alcohol Use Standard Drinks/Week Comments  No 0 (1 standard drink = 0.6 oz pure alcohol)     Social History   Socioeconomic History  . Marital status: Divorced    Spouse name: Not on file  . Number of children: Not on file  . Years of education: Not on file  . Highest education level: Not on file  Occupational  History  . Not on file  Tobacco Use  . Smoking status: Not on file  . Smokeless tobacco: Not on file  Substance and Sexual Activity  . Alcohol use: Not on file  . Drug use: Not on file  . Sexual activity: Not on file  Other Topics Concern  . Not on file  Social History Narrative  . Not on file   Social Determinants of Health   Financial Resource Strain: Not on file  Food Insecurity: Not on file  Transportation Needs: Not on file  Physical Activity: Not on file  Stress: Not on file  Social Connections: Not on file  Intimate Partner Violence: Not on file    Family History:    Family History  Family history unknown: Yes     ROS:  Please see the history of present illness.  Very difficult to obtain, because he cannot really passed on history.  He still has dyspnea.  He has been having loose stools and therefore was placed on contact precaution.  He himself has not been eating very much [place] refuses MBS and does not want PEG tube placement. All other ROS reviewed and negative.     Physical Exam/Data:   Vitals:   12/19/20 0806 12/19/20 1252 12/19/20 1741 12/19/20 2018  BP: (!) 109/48 (!) 106/46  (!) 121/49  Pulse: 62 (!) 54 (!) 110 (!) 40  Resp: 16 18  20   Temp: 98 F (36.7 C) 97.8 F (36.6 C)  98.4 F (36.9 C)  TempSrc: Oral Oral  Oral  SpO2: 99% 98% 98% 94%  Weight:      Height:        Intake/Output Summary (Last 24 hours) at 12/19/2020 2024 Last data filed at 12/19/2020 0000 Gross per 24 hour  Intake --  Output 450 ml  Net -450 ml   Last 3 Weights 12/18/2020  Weight (lbs) 137 lb  Weight (kg) 62.143 kg     Body mass index is 21.46 kg/m.  Physical Exam Constitutional:      General: He is not in acute distress.    Appearance: He is ill-appearing. He is not toxic-appearing (Borderline cachectic).  HENT:     Head: Normocephalic and atraumatic.     Nose: Congestion present.     Mouth/Throat:     Mouth: Mucous membranes are dry.     Comments: Poor  teeth Eyes:     Pupils: Pupils are equal, round, and reactive to light.     Comments: Right seems somewhat less mobile with also eyebrow ptosis.  Neck:     Comments: He has a laryngectomy stoma with blow-by oxygen. Cardiovascular:     Rate and Rhythm: Normal rate. Rhythm irregular. Frequent extrasystoles are present.    Chest Wall: PMI is not displaced.     Pulses: Decreased pulses.     Heart sounds: S1 normal and S2 normal. Heart sounds are distant. Murmur heard.  High-pitched blowing holosystolic murmur is present with a grade of 3/6 at the apex radiating to the neck. No friction rub. No gallop.   Pulmonary:     Effort: Pulmonary effort is normal. No respiratory distress.     Breath sounds: Stridor present. Rhonchi present.     Comments: Oxygen by blow-by through stoma.  Coarse breath sounds throughout.  Upper airway shrill stridorous type sounds.  Rhonchi but no obvious rales.  No wheezes Chest:     Chest wall: Tenderness present.  Abdominal:     Palpations: There is no mass.     Tenderness: There is no rebound.     Hernia: No hernia is present.  Musculoskeletal:        General: Deformity present. No swelling (May be trivial pedal). Normal range of motion.  Skin:    General: Skin is warm and dry.     Coloration: Skin is pale.     Comments: Extremely dry, scaly skin on legs and feet.  Neurological:     General: No focal deficit present.     Mental Status: He is alert and oriented to person, place, and time.  Psychiatric:        Mood and Affect: Mood normal.        Behavior: Behavior normal.        Thought Content: Thought content normal.  Judgment: Judgment normal.     Comments: Seems somewhat tired and lethargic.     Relevant CV Studies: Procedure Laterality Date  . CORONARY STENT PLACEMENT  03/2012   [facility]: DES x2 to RCA.  Marland Kitchen LEFT HEART CATH AND CORONARY ANGIOGRAPHY  03/2012   ([facility])-inferior STEMI, 100% RCA.  Marland Kitchen TRANSTHORACIC ECHOCARDIOGRAM  01/22/2020   EF  estimated greater than 55%.  Mild LV thickening.  Normal RV function.  Moderate MR.  Otherwise trivial AI, PRN TR.  Otherwise normal     Laboratory Data:  High Sensitivity Troponin:  No results for input(s): TROPONINIHS in the last 720 hours.   Chemistry Recent Labs  Lab 12/18/20 0449 12/18/20 0708 12/18/20 2256 12/19/20 0507  NA QUESTIONABLE IDENTIFICATION / INCORRECTLY LABELED SPECIMEN 135  --  138  K QUESTIONABLE IDENTIFICATION / INCORRECTLY LABELED SPECIMEN 4.5  --  3.9  CL QUESTIONABLE IDENTIFICATION / INCORRECTLY LABELED SPECIMEN 99  --  106  CO2 QUESTIONABLE IDENTIFICATION / INCORRECTLY LABELED SPECIMEN 24  --  22  GLUCOSE QUESTIONABLE IDENTIFICATION / INCORRECTLY LABELED SPECIMEN 108*  --  102*  BUN QUESTIONABLE IDENTIFICATION / INCORRECTLY LABELED SPECIMEN 64*  --  48*  CREATININE QUESTIONABLE IDENTIFICATION / INCORRECTLY LABELED SPECIMEN 1.65* 1.10 1.04  CALCIUM QUESTIONABLE IDENTIFICATION / INCORRECTLY LABELED SPECIMEN 8.4*  --  7.8*  GFRNONAA QUESTIONABLE IDENTIFICATION / INCORRECTLY LABELED SPECIMEN 43* >60 >60  GFRAA QUESTIONABLE IDENTIFICATION / INCORRECTLY LABELED SPECIMEN  --   --   --   ANIONGAP QUESTIONABLE IDENTIFICATION / INCORRECTLY LABELED SPECIMEN 12  --  10    Recent Labs  Lab 12/18/20 0449 12/18/20 0708 12/19/20 0507  PROT QUESTIONABLE IDENTIFICATION / INCORRECTLY LABELED SPECIMEN 7.6 5.9*  ALBUMIN QUESTIONABLE IDENTIFICATION / INCORRECTLY LABELED SPECIMEN 3.0* 2.2*  AST QUESTIONABLE IDENTIFICATION / INCORRECTLY LABELED SPECIMEN 18 16  ALT QUESTIONABLE IDENTIFICATION / INCORRECTLY LABELED SPECIMEN 13 13  ALKPHOS QUESTIONABLE IDENTIFICATION / INCORRECTLY LABELED SPECIMEN 97 79  BILITOT QUESTIONABLE IDENTIFICATION / INCORRECTLY LABELED SPECIMEN 1.6* 1.0   Hematology Recent Labs  Lab 12/18/20 0708 12/18/20 2256 12/19/20 0507  WBC 33.0* 25.7* 19.7*  RBC 4.10* 3.86* 3.33*  HGB 11.5* 10.9* 9.5*  HCT 35.7* 34.1* 29.6*  MCV 87.1 88.3 88.9  MCH 28.0  28.2 28.5  MCHC 32.2 32.0 32.1  RDW 15.8* 15.9* 15.9*  PLT 236 227 200   BNPNo results for input(s): BNP, PROBNP in the last 168 hours.  DDimer No results for input(s): DDIMER in the last 168 hours.   Radiology/Studies:  DG Chest 2 View  Result Date: 12/18/2020 CLINICAL DATA:  Dyspnea.  Fall. EXAM: CHEST - 2 VIEW COMPARISON:  12/13/2014 FINDINGS: Opacity at both lung bases. No Kerley lines, effusion, or pneumothorax seen. Normal heart size and aortic contours. Surgical clips overlap the thoracic inlet. IMPRESSION: Bilateral lower lobe infiltrate. Electronically Signed   By: Monte Fantasia M.D.   On: 12/18/2020 06:19   DG Thoracic Spine 2 View  Result Date: 12/18/2020 CLINICAL DATA:  Dyspnea and fall EXAM: THORACIC SPINE 2 VIEWS COMPARISON:  None. FINDINGS: No evidence of fracture or bone lesion. Mid and lower thoracic disc narrowing and spurring with 2 non segmented or ankylosed midthoracic vertebrae. No evident bone lesion. Lower lobe infiltrates described on dedicated chest x-ray. IMPRESSION: No acute finding. Electronically Signed   By: Monte Fantasia M.D.   On: 12/18/2020 06:21   DG Lumbar Spine Complete  Result Date: 12/18/2020 CLINICAL DATA:  Fall with dyspnea EXAM: LUMBAR SPINE - COMPLETE 4+ VIEW COMPARISON:  03/14/2017 FINDINGS: Five  lumbar type vertebrae Advanced mid and lower lumbar disc degeneration with disc collapse and ridging that is progressed. Degenerative facet spurring in the lower lumbar spine. Mild L3-4 retrolisthesis, also seen on prior. Slight dextroscoliosis. No overt bone lesion or erosion. Prominent gas within colon and stool at the rectum. Atherosclerosis. IMPRESSION: 1. No acute finding. 2. Advanced lower lumbar disc degeneration that has progressed from 2018. 3. Prominent colonic gas and rectal stool. Electronically Signed   By: Monte Fantasia M.D.   On: 12/18/2020 06:23   DG Pelvis 1-2 Views  Result Date: 12/18/2020 CLINICAL DATA:  Fall with upper and lower  back pain. EXAM: PELVIS - 1-2 VIEW COMPARISON:  None. FINDINGS: Rotated pelvis with no evidence of fracture or subluxation. Lower lumbar spine degeneration. Prominent rectosigmoid stool with 10 cm transverse rectal span. The cecum is gas distended to 11 cm. IMPRESSION: 1. No acute finding in the pelvis. 2. Rectosigmoid distention by stool. Electronically Signed   By: Monte Fantasia M.D.   On: 12/18/2020 06:24   CT Head Wo Contrast  Result Date: 12/18/2020 CLINICAL DATA:  Fall with head trauma EXAM: CT HEAD WITHOUT CONTRAST CT CERVICAL SPINE WITHOUT CONTRAST TECHNIQUE: Multidetector CT imaging of the head and cervical spine was performed following the standard protocol without intravenous contrast. Multiplanar CT image reconstructions of the cervical spine were also generated. COMPARISON:  None. FINDINGS: CT HEAD FINDINGS Brain: No evidence of acute infarction, hemorrhage, hydrocephalus, extra-axial collection or mass lesion/mass effect. Generalized atrophy Vascular: No hyperdense vessel or unexpected calcification. Skull: Normal. Negative for fracture or focal lesion. Sinuses/Orbits: Negative CT CERVICAL SPINE FINDINGS Alignment: Dextroscoliosis. Skull base and vertebrae: No acute fracture Soft tissues and spinal canal: No prevertebral fluid or swelling. No visible canal hematoma. Laryngectomy and neck dissection with transesophageal puncture. Disc levels: Severe facet osteoarthritis with asymmetric bulky spurring on the left. Generalized disc degeneration focally advanced at C6-7. Upper chest: Negative IMPRESSION: No evidence of acute intracranial or cervical spine injury. Electronically Signed   By: Monte Fantasia M.D.   On: 12/18/2020 06:45   CT Cervical Spine Wo Contrast  Result Date: 12/18/2020 CLINICAL DATA:  Fall with head trauma EXAM: CT HEAD WITHOUT CONTRAST CT CERVICAL SPINE WITHOUT CONTRAST TECHNIQUE: Multidetector CT imaging of the head and cervical spine was performed following the standard  protocol without intravenous contrast. Multiplanar CT image reconstructions of the cervical spine were also generated. COMPARISON:  None. FINDINGS: CT HEAD FINDINGS Brain: No evidence of acute infarction, hemorrhage, hydrocephalus, extra-axial collection or mass lesion/mass effect. Generalized atrophy Vascular: No hyperdense vessel or unexpected calcification. Skull: Normal. Negative for fracture or focal lesion. Sinuses/Orbits: Negative CT CERVICAL SPINE FINDINGS Alignment: Dextroscoliosis. Skull base and vertebrae: No acute fracture Soft tissues and spinal canal: No prevertebral fluid or swelling. No visible canal hematoma. Laryngectomy and neck dissection with transesophageal puncture. Disc levels: Severe facet osteoarthritis with asymmetric bulky spurring on the left. Generalized disc degeneration focally advanced at C6-7. Upper chest: Negative IMPRESSION: No evidence of acute intracranial or cervical spine injury. Electronically Signed   By: Monte Fantasia M.D.   On: 12/18/2020 06:45   US RENAL  Result Date: 12/18/2020 CLINICAL DATA:  Acute kidney injury. EXAM: RENAL / URINARY TRACT ULTRASOUND COMPLETE COMPARISON:  None. FINDINGS: Right Kidney: Renal measurements: 8.1 x 3.7 x 4.0 cm = volume: 62 mL. Echogenicity within normal limits. No mass or hydronephrosis visualized. Left Kidney: Not visualized due to overlying bowel gas. Bladder: Appears normal for degree of bladder distention. Other: None. IMPRESSION: Normal  right kidney. Left kidney not visualized due to overlying bowel gas. Electronically Signed   By: Marijo Conception M.D.   On: 12/18/2020 15:29   DG Chest Port 1 View  Result Date: 12/18/2020 CLINICAL DATA:  Questionable sepsis EXAM: PORTABLE CHEST 1 VIEW COMPARISON:  None. FINDINGS: Infiltrates at the lung bases with small left pleural effusion. Normal heart size and mediastinal contours. Lucent appearance of the upper lungs but no generalized hyperinflation. Postoperative neck. IMPRESSION:  Left more than right lower lobe infiltrates. Electronically Signed   By: Monte Fantasia M.D.   On: 12/18/2020 07:09     Assessment and Plan:   Principal Problem:   Bilateral upper lobe community acquired pneumonia Active Problems:   Coronary artery disease involving native heart without angina pectoris   Pressure injury of skin   Atrial premature contractions   Sinus pause   Wandering pacemaker  Principal Problem:   Bilateral upper lobe community acquired pneumonia -> antibiotics per [facility] hospitalist service.  I suspect this may be was driving his tachycardia's.    Coronary artery disease involving native heart without angina pectoris -> no recurrent angina.  Not on DAPT.  Continue with aspirin, when pulmonary function clears up may consider beta-blocker  Also having difficulty with tolerating statin.  Simply continue home medications follow-up with primary cardiologist.    Atrial premature contractions/  Sinus pause /  Wandering pacemaker  I spent about half an hour reviewing his telemetry and rhythm strips.  Multiple things found.  He has had at least 3 if not 4 pauses ranging from 2 to 3 seconds.  This in the setting of what appears to be atrial bigeminy but with 1 following a PVC.  He is completely asymptomatic.  No syncope.  The question of potential atrial fibrillation is difficult.  It appears to be sinus rhythm with PACs quite often and in bigeminy.  There is also a hint of PR ligation but no true evidence of Mobitz type II heart block.  Would ask our electrophysiology team to review the telemetry strips and EKGs in the morning.  This can help clarify if there is potential concerning evidence of atrial fib.  On my view there is at least 2 strips that are very short that are difficult to determine if there is A. fib or not, the rest all seems to be probably atrial tachycardia spells with wandering atrial pacemaker.  For now, given the ongoing infection and lung issues,  would hold off on beta-blocker, can readdress when she is stabilized.    Risk Assessment/Risk Scores:   N/A  For questions or updates, please contact [facility] Please consult [email or web address] for contact info under    Signed, Glenetta Hew, MD  12/19/2020 8:24 PM

## 2020-12-19 NOTE — Evaluation (Signed)
Clinical/Bedside Swallow Evaluation Patient Details  Name: Ryan Miles MRN: [id number] Date of Birth: Apr 16, 1946  Today's Date: 12/19/2020 Time: SLP Start Time (ACUTE ONLY): 1140 SLP Stop Time (ACUTE ONLY): 1230 SLP Time Calculation (min) (ACUTE ONLY): 50 min  Past Medical History:  Past Medical History:  Diagnosis Date  . 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 Hypertension   . Lung cancer ([place])   . Spinal stenosis    Past Surgical History: History reviewed. No pertinent surgical history.   HPI:  75yo male admitted 12/18/20 with AMS, increasing confusion and weakness. PMH: laryngectomy with TEP, esophageal cancer, lung cancer, HTN, hypothyroidism. Pt found to have bilateral PNA and AKI   Assessment / Plan / Recommendation Clinical Impression  Pt seen at bedside for evaluation of swallow function and safety. Pt was resting in bed upon arrival of SLP. Daughter present. Pt had a laryngectomy 8 years ago, and has a TEP with prosthesis per daughter. Pt communicates fairly well mouthing words, but gets frustrated easily. Pt coughing up moderate thick mucus from stoma - recommend respiratory therapy to consult for management of mucus.  SLP set up suction and completed oral care. Pt is edentulous and does not have dentures. Pt accepted trials of ice chips, water, and pureed textures. No obvious oral issues, and no overt s/s aspiration via TEP. Pt has a history of esophageal dilation, and has recently had poor po intake.  SLP encouraged consideration of MBS to objectively assess oropharyngeal swallow, however, pt declined, stating it would be of little benefit. Pt also indicated that he does not want to consider feeding tube placement.  Will continue regular diet and thin liquids. SLP will follow up for education and to answer questions they may have. Also recommend palliative care consult to facilitate establishment of appropriate goals of care.  SLP Visit Diagnosis: Dysphagia, unspecified (R13.10)     Aspiration Risk  Moderate aspiration risk (due to TEP)    Diet Recommendation Regular;Thin liquid   Liquid Administration via: Cup Medication Administration: Whole meds with liquid Supervision: Patient able to self feed;Staff to assist with self feeding Compensations: Slow rate;Small sips/bites Postural Changes: Seated upright at 90 degrees;Remain upright for at least 30 minutes after po intake    Other  Recommendations Oral Care Recommendations: Oral care BID   Follow up Recommendations Other (comment) (tbd)      Frequency and Duration min 1 x/week  1 week       Prognosis Prognosis for Safe Diet Advancement: Good      Swallow Study   General Date of Onset: 12/18/20 HPI: 75yo male admitted 12/18/20 with AMS, increasing confusion and weakness. PMH: laryngectomy with TEP, esophageal cancer, lung cancer, HTN, hypothyroidism. Pt found to have bilateral PNA and AKI Type of Study: Bedside Swallow Evaluation Previous Swallow Assessment: none Diet Prior to this Study: Regular;Thin liquids Temperature Spikes Noted: No Respiratory Status: Trach Collar History of Recent Intubation: No Behavior/Cognition: Alert;Cooperative;Pleasant mood Oral Cavity Assessment: Within Functional Limits Oral Care Completed by SLP: Yes Oral Cavity - Dentition: Edentulous Vision: Functional for self-feeding Self-Feeding Abilities: Needs assist Patient Positioning: Upright in bed Baseline Vocal Quality: Aphonic Volitional Cough: Weak Volitional Swallow: Able to elicit    Oral/Motor/Sensory Function Overall Oral Motor/Sensory Function: Within functional limits   Ice Chips Ice chips: Within functional limits Presentation: Spoon   Thin Liquid Thin Liquid: Within functional limits Presentation: Cup    Nectar Thick Nectar Thick Liquid: Not tested   Honey Thick Honey Thick Liquid: Not tested  Puree Puree: Within functional limits Presentation: Spoon   Solid     Solid: Not tested     Celia B.  Quentin Ore, [facility], [place] Speech Language Pathologist Office: [phone] Pager: [phone]  Shonna Chock 12/19/2020,1:08 PM

## 2020-12-19 NOTE — Progress Notes (Signed)
[facility]  PROGRESS NOTE  Ryan Miles TKZ:[id number] DOB: 1945-10-17 DOA: 12/18/2020 PCP: [name], MD   Brief HPI:   75 year old male with medical history of esophageal carcinoma, lung carcinoma, hypertension, hypothyroidism presents with altered mental status.  As per daughter patient has been having intermittent episodes of confusion and increasing weakness for past few days.  Also has been talking nonsensical at times.  He was supposed to have a OT visit but was having generalized weakness that was concerning.  Therapist told that it was possible UTI which was causing all the symptoms.  Patient fell out of bed so EMS was called and he was brought to ED.  In the ED patient was found to be hypoxemic on room air, found to have bilateral pneumonia and AKI.  Started on IV antibiotics and IV fluids.    Subjective   Patient seen and examined, still requiring oxygen.  Patient had laryngectomy 8 years ago, and has post laryngectomy stoma.   Assessment/Plan:     1. Acute hypoxemic respiratory failure-secondary to bilateral lower lobe pneumonia, currently requiring 3 L/min oxygen via nonrebreather mask.  Blood culture is negative to date.  Follow urine for Legionella, strep pneumo urinary antigen.  Continue Rocephin and Zithromax.  Will consult respiratory therapy to manage secretions. 2. Altered mental status-resolved, likely from above.  Significantly improved.  CT head was negative. 3. Questionable dysphagia-concern for aspiration pneumonia, speech therapy was consulted for swallow evaluation, patient has refused MBS and does not want PEG tube placement.  Will consult palliative care for further clarification of goals of care. 4. Acute kidney injury-creatinine is 1.65 on admission, has improved with IV fluids.  Today creatinine is 1.04.  Renal ultrasound shows no acute abnormality. 5. Hypertension-blood pressure is soft, home blood pressure medications including metoprolol,  ramipril are on hold. 6. Hypothyroidism-continue Synthroid 7. Constipation-continue home regimen including Colace, MiraLAX. 8. History of esophageal cancer/s/p laryngectomy-patient has posterior injecting stoma, will consult respiratory therapy for management of secretions.     COVID-19 Labs  No results for input(s): DDIMER, FERRITIN, LDH, CRP in the last 72 hours.  Lab Results  Component Value Date   [place] NEGATIVE 12/18/2020   [place]  12/18/2020    QUESTIONABLE IDENTIFICATION / INCORRECTLY LABELED SPECIMEN     Scheduled medications:   . aspirin EC  81 mg Oral Daily  . docusate sodium  100 mg Oral BID  . DULoxetine  20 mg Oral Daily  . enoxaparin (LOVENOX) injection  40 mg Subcutaneous Q24H  . feeding supplement  237 mL Oral BID BM  . gabapentin  300 mg Oral TID  . guaiFENesin  600 mg Oral BID  . levothyroxine  88 mcg Oral Daily  . methadone  5 mg Oral QID  . senna  1 tablet Oral BID  . cyanocobalamin  1,000 mcg Oral Daily       Data Reviewed:   CBG: No results for input(s): GLUCAP in the last 168 hours.  SpO2: 98 % O2 Flow Rate (L/min): 3 L/min      CBC:  Recent Labs  Lab 12/18/20 0449 12/18/20 0708 12/18/20 2256 12/19/20 0507  WBC QUESTIONABLE IDENTIFICATION / INCORRECTLY LABELED SPECIMEN 33.0* 25.7* 19.7*  HGB QUESTIONABLE IDENTIFICATION / INCORRECTLY LABELED SPECIMEN 11.5* 10.9* 9.5*  HCT QUESTIONABLE IDENTIFICATION / INCORRECTLY LABELED SPECIMEN 35.7* 34.1* 29.6*  PLT QUESTIONABLE IDENTIFICATION / INCORRECTLY LABELED SPECIMEN 236 227 200  MCV QUESTIONABLE IDENTIFICATION / INCORRECTLY LABELED SPECIMEN 87.1 88.3 88.9  MCH QUESTIONABLE IDENTIFICATION / INCORRECTLY  LABELED SPECIMEN 28.0 28.2 28.5  MCHC QUESTIONABLE IDENTIFICATION / INCORRECTLY LABELED SPECIMEN 32.2 32.0 32.1  RDW QUESTIONABLE IDENTIFICATION / INCORRECTLY LABELED SPECIMEN 15.8* 15.9* 15.9*  LYMPHSABS QUESTIONABLE IDENTIFICATION / INCORRECTLY LABELED SPECIMEN 0.7  --   --    MONOABS QUESTIONABLE IDENTIFICATION / INCORRECTLY LABELED SPECIMEN 0.7  --   --   EOSABS QUESTIONABLE IDENTIFICATION / INCORRECTLY LABELED SPECIMEN 0.0  --   --   BASOSABS QUESTIONABLE IDENTIFICATION / INCORRECTLY LABELED SPECIMEN 0.0  --   --     Complete metabolic panel:  Recent Labs  Lab 12/18/20 0449 12/18/20 0625 12/18/20 0708 12/18/20 0905 12/18/20 2256 12/19/20 0507  NA QUESTIONABLE IDENTIFICATION / INCORRECTLY LABELED SPECIMEN  --  135  --   --  138  K QUESTIONABLE IDENTIFICATION / INCORRECTLY LABELED SPECIMEN  --  4.5  --   --  3.9  CL QUESTIONABLE IDENTIFICATION / INCORRECTLY LABELED SPECIMEN  --  99  --   --  106  CO2 QUESTIONABLE IDENTIFICATION / INCORRECTLY LABELED SPECIMEN  --  24  --   --  22  GLUCOSE QUESTIONABLE IDENTIFICATION / INCORRECTLY LABELED SPECIMEN  --  108*  --   --  102*  BUN QUESTIONABLE IDENTIFICATION / INCORRECTLY LABELED SPECIMEN  --  64*  --   --  48*  CREATININE QUESTIONABLE IDENTIFICATION / INCORRECTLY LABELED SPECIMEN  --  1.65*  --  1.10 1.04  CALCIUM QUESTIONABLE IDENTIFICATION / INCORRECTLY LABELED SPECIMEN  --  8.4*  --   --  7.8*  AST QUESTIONABLE IDENTIFICATION / INCORRECTLY LABELED SPECIMEN  --  18  --   --  16  ALT QUESTIONABLE IDENTIFICATION / INCORRECTLY LABELED SPECIMEN  --  13  --   --  13  ALKPHOS QUESTIONABLE IDENTIFICATION / INCORRECTLY LABELED SPECIMEN  --  97  --   --  79  BILITOT QUESTIONABLE IDENTIFICATION / INCORRECTLY LABELED SPECIMEN  --  1.6*  --   --  1.0  ALBUMIN QUESTIONABLE IDENTIFICATION / INCORRECTLY LABELED SPECIMEN  --  3.0*  --   --  2.2*  LATICACIDVEN  --  QUESTIONABLE IDENTIFICATION / INCORRECTLY LABELED SPECIMEN 1.9 1.7  --   --   INR  --   --  1.3*  --   --   --     No results for input(s): LIPASE, AMYLASE in the last 168 hours.  Recent Labs  Lab 12/18/20 0435 12/18/20 1127  [place] IDENTIFICATION / INCORRECTLY LABELED SPECIMEN NEGATIVE     ------------------------------------------------------------------------------------------------------------------  Coagulation profile  Recent Labs  Lab 12/18/20 0708  INR 1.3*       Radiology Reports  DG Chest 2 View  Result Date: 12/18/2020 CLINICAL DATA:  Dyspnea.  Fall. EXAM: CHEST - 2 VIEW COMPARISON:  12/13/2014 FINDINGS: Opacity at both lung bases. No Kerley lines, effusion, or pneumothorax seen. Normal heart size and aortic contours. Surgical clips overlap the thoracic inlet. IMPRESSION: Bilateral lower lobe infiltrate. Electronically Signed   By: Monte Fantasia M.D.   On: 12/18/2020 06:19   DG Thoracic Spine 2 View  Result Date: 12/18/2020 CLINICAL DATA:  Dyspnea and fall EXAM: THORACIC SPINE 2 VIEWS COMPARISON:  None. FINDINGS: No evidence of fracture or bone lesion. Mid and lower thoracic disc narrowing and spurring with 2 non segmented or ankylosed midthoracic vertebrae. No evident bone lesion. Lower lobe infiltrates described on dedicated chest x-ray. IMPRESSION: No acute finding. Electronically Signed   By: Monte Fantasia M.D.   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 Degenerative facet spurring in the lower lumbar spine. Mild L3-4 retrolisthesis, also seen on prior. Slight dextroscoliosis. No overt bone lesion or erosion. Prominent gas within colon and stool at the rectum. Atherosclerosis. IMPRESSION: 1. No acute finding. 2. Advanced lower lumbar disc degeneration that has progressed from 2018. 3. Prominent colonic gas and rectal stool. Electronically Signed   By: Monte Fantasia M.D.   On: 12/18/2020 06:23   DG Pelvis 1-2 Views  Result Date: 12/18/2020 CLINICAL DATA:  Fall with upper and lower back pain.  EXAM: PELVIS - 1-2 VIEW COMPARISON:  None. FINDINGS: Rotated pelvis with no evidence of fracture or subluxation. Lower lumbar spine degeneration. Prominent rectosigmoid stool with 10 cm transverse rectal span. The cecum is gas distended to 11 cm. IMPRESSION: 1. No acute finding in the pelvis. 2. Rectosigmoid distention by stool. Electronically Signed   By: Monte Fantasia M.D.   On: 12/18/2020 06:24   CT Head Wo Contrast  Result Date: 12/18/2020 CLINICAL DATA:  Fall with head trauma EXAM: CT HEAD WITHOUT CONTRAST CT CERVICAL SPINE WITHOUT CONTRAST TECHNIQUE: Multidetector CT imaging of the head and cervical spine was performed following the standard protocol without intravenous contrast. Multiplanar CT image reconstructions of the cervical spine were also generated. COMPARISON:  None. FINDINGS: CT HEAD FINDINGS Brain: No evidence of acute infarction, hemorrhage, hydrocephalus, extra-axial collection or mass lesion/mass effect. Generalized atrophy Vascular: No hyperdense vessel or unexpected calcification. Skull: Normal. Negative for fracture or focal lesion. Sinuses/Orbits: Negative CT CERVICAL SPINE FINDINGS Alignment: Dextroscoliosis. Skull base and vertebrae: No acute fracture Soft tissues and spinal canal: No prevertebral fluid or swelling. No visible canal hematoma. Laryngectomy and neck dissection with transesophageal puncture. Disc levels: Severe facet osteoarthritis with asymmetric bulky spurring on the left. Generalized disc degeneration focally advanced at C6-7. Upper chest: Negative IMPRESSION: No evidence of acute intracranial or cervical spine injury. Electronically Signed   By: Monte Fantasia M.D.   On: 12/18/2020 06:45   CT Cervical Spine Wo Contrast  Result Date: 12/18/2020 CLINICAL DATA:  Fall with head trauma EXAM: CT HEAD WITHOUT CONTRAST CT CERVICAL SPINE WITHOUT CONTRAST TECHNIQUE: Multidetector CT imaging of the head and cervical spine was performed following the standard protocol  without intravenous contrast. Multiplanar CT image reconstructions of the cervical spine were also generated. COMPARISON:  None. FINDINGS: CT HEAD FINDINGS Brain: No evidence of acute infarction, hemorrhage, hydrocephalus, extra-axial collection or mass lesion/mass effect. Generalized atrophy Vascular: No hyperdense vessel or unexpected calcification. Skull: Normal. Negative for fracture or focal lesion. Sinuses/Orbits: Negative CT CERVICAL SPINE FINDINGS Alignment: Dextroscoliosis. Skull base and vertebrae: No acute fracture Soft tissues and spinal canal: No prevertebral fluid or swelling. No visible canal hematoma. Laryngectomy and neck dissection with transesophageal puncture. Disc levels: Severe facet osteoarthritis with asymmetric bulky spurring on the left. Generalized disc degeneration focally advanced at C6-7. Upper chest: Negative IMPRESSION: No evidence of acute intracranial or cervical spine injury. Electronically Signed   By: Monte Fantasia M.D.   On: 12/18/2020 06:45   US RENAL  Result Date: 12/18/2020 CLINICAL DATA:  Acute kidney injury. EXAM: RENAL / URINARY TRACT ULTRASOUND COMPLETE COMPARISON:  None. FINDINGS: Right Kidney: Renal measurements: 8.1 x 3.7 x 4.0 cm = volume: 62 mL. Echogenicity within normal limits. No  mass or hydronephrosis visualized. Left Kidney: Not visualized due to overlying bowel gas. Bladder: Appears normal for degree of bladder distention. Other: None. IMPRESSION: Normal right kidney. Left kidney not visualized due to overlying bowel gas. Electronically Signed   By: Marijo Conception M.D.   On: 12/18/2020 15:29   DG Chest Port 1 View  Result Date: 12/18/2020 CLINICAL DATA:  Questionable sepsis EXAM: PORTABLE CHEST 1 VIEW COMPARISON:  None. FINDINGS: Infiltrates at the lung bases with small left pleural effusion. Normal heart size and mediastinal contours. Lucent appearance of the upper lungs but no generalized hyperinflation. Postoperative neck. IMPRESSION: Left more  than right lower lobe infiltrates. Electronically Signed   By: Monte Fantasia M.D.   On: 12/18/2020 07:09      Antibiotics: Anti-infectives (From admission, onward)   Start     Dose/Rate Route Frequency Ordered Stop   12/18/20 0645  cefTRIAXone (ROCEPHIN) 2 g in sodium chloride 0.9 % 100 mL IVPB        2 g 200 mL/hr over 30 Minutes Intravenous Every 24 hours 12/18/20 0641     12/18/20 0645  azithromycin (ZITHROMAX) 500 mg in sodium chloride 0.9 % 250 mL IVPB        500 mg 250 mL/hr over 60 Minutes Intravenous Every 24 hours 12/18/20 0641         DVT prophylaxis: Lovenox  Code Status: Full code  Family Communication: Discussed with patient's daughter at bedside   Consultants:    Procedures:      Objective   Vitals:   12/18/20 2137 12/19/20 0543 12/19/20 0806 12/19/20 1252  BP: (!) 118/51 (!) 110/42 (!) 109/48 (!) 106/46  Pulse: 99 90 62 (!) 54  Resp:  20 16 18   Temp: 98.2 F (36.8 C) 98.5 F (36.9 C) 98 F (36.7 C) 97.8 F (36.6 C)  TempSrc: Axillary Axillary Oral Oral  SpO2: 90% 100% 99% 98%  Weight:      Height:        Intake/Output Summary (Last 24 hours) at 12/19/2020 1630 Last data filed at 12/19/2020 0000 Gross per 24 hour  Intake -  Output 450 ml  Net -450 ml    03/16 1901 - 03/18 0700 In: 1000  Out: 450 [Urine:450]  Filed Weights   12/18/20 0912  Weight: 62.1 kg    Physical Examination:   General-appears in no acute distress Heart-S1-S2, regular, no murmur auscultated HEENT-post laryngectomy stoma noted Lungs-clear to auscultation bilaterally, no wheezing or crackles auscultated Abdomen-soft, nontender, no organomegaly Extremities-no edema in the lower extremities Neuro-alert, oriented x3, no focal deficit noted  Status is: Inpatient  Dispo: The patient is from: Home              Anticipated d/c is to: To be decided              Anticipated d/c date is: 12/24/2020              Patient currently not stable for  discharge  Barrier to discharge-ongoing treatment for acute hypoxemic respiratory failure  Microbiology  Recent Results (from the past 240 hour(s))  Resp Panel by RT-PCR (Flu A&B, Covid) Urine, Catheterized     Status: None   Collection Time: 12/18/20  4:35 AM   Specimen: Urine, Catheterized; Nasopharyngeal(NP) swabs in vial transport medium  Result Value Ref Range Status   SARS Coronavirus 2 by RT PCR  NEGATIVE Corrected    QUESTIONABLE IDENTIFICATION / INCORRECTLY LABELED SPECIMEN    Comment: CORRECTED  ON 03/17 AT 0656: PREVIOUSLY REPORTED AS NEGATIVE   Influenza A by PCR  NEGATIVE Corrected    QUESTIONABLE IDENTIFICATION / INCORRECTLY LABELED SPECIMEN    Comment: CORRECTED ON 03/17 AT 7124: PREVIOUSLY REPORTED AS NEGATIVE   Influenza B by PCR  NEGATIVE Corrected    QUESTIONABLE IDENTIFICATION / INCORRECTLY LABELED SPECIMEN    Comment: Performed at [facility], [place] [address] [place], [place] [address] CORRECTED ON 03/17 AT 8338: PREVIOUSLY REPORTED AS NEGATIVE   Blood Culture (routine x 2)     Status: None (Preliminary result)   Collection Time: 12/18/20  7:05 AM   Specimen: Right Antecubital; Blood  Result Value Ref Range Status   Specimen Description   Final    RIGHT ANTECUBITAL Performed at [facility] [address] [place], [place] [address]    Special Requests   Final    BOTTLES DRAWN AEROBIC AND ANAEROBIC Blood Culture adequate volume Performed at [facility] [address], [place], [place] [address]    Culture   Final    NO GROWTH < 24 HOURS Performed at [facility] [address] [place], [place] [address]    Report Status PENDING  Incomplete  Blood Culture (routine x 2)     Status: None (Preliminary result)   Collection Time: 12/18/20  7:08 AM   Specimen: Left Antecubital; Blood  Result Value Ref Range Status   Specimen Description   Final    LEFT ANTECUBITAL Performed at [facility] [address], [place], [place] [address]    Special Requests   Final    BOTTLES DRAWN AEROBIC AND ANAEROBIC Blood Culture adequate volume Performed at [facility] [address] [place], [place] [address]    Culture   Final    NO GROWTH < 24 HOURS Performed at [facility] [address] [place], [place] [address]    Report Status PENDING  Incomplete  Resp Panel by RT-PCR (Flu A&B, Covid) Nasopharyngeal Swab     Status: None   Collection Time: 12/18/20 11:27 AM   Specimen: Nasopharyngeal Swab; Nasopharyngeal(NP) swabs in vial transport medium  Result Value Ref Range Status   SARS Coronavirus 2 by RT PCR NEGATIVE NEGATIVE Final    Comment: (NOTE) SARS-CoV-2 target nucleic acids are NOT DETECTED.  The SARS-CoV-2 RNA is generally detectable in upper respiratory specimens during the acute phase of infection. The lowest concentration of SARS-CoV-2 viral copies this assay can detect is 138 copies/mL. A negative result does not preclude SARS-Cov-2 infection and should not be used as the sole basis for treatment or other patient management decisions. A negative result may occur with  improper specimen collection/handling, submission of specimen other than nasopharyngeal swab, presence of viral mutation(s) within the areas targeted by this assay, and inadequate number of viral copies(<138 copies/mL). A negative result must be combined with clinical observations, patient history, and epidemiological information. The expected result is Negative.  Fact Sheet for Patients:  EntrepreneurPulse.com.au  Fact Sheet for Healthcare Providers:  IncredibleEmployment.be  This test is no t yet approved or cleared by the [place] FDA and  has been authorized for detection and/or diagnosis of SARS-CoV-2 by FDA under an Emergency Use Authorization (EUA). This EUA will remain  in effect (meaning this test can be  used) for the duration of the COVID-19 declaration under Section 564(b)(1) of the Act, 21 U.S.C.section 360bbb-3(b)(1), unless the authorization is terminated  or revoked  sooner.       Influenza A by PCR NEGATIVE NEGATIVE Final   Influenza B by PCR NEGATIVE NEGATIVE Final    Comment: (NOTE) The Xpert Xpress SARS-CoV-2/FLU/RSV plus assay is intended as an aid in the diagnosis of influenza from Nasopharyngeal swab specimens and should not be used as a sole basis for treatment. Nasal washings and aspirates are unacceptable for Xpert Xpress SARS-CoV-2/FLU/RSV testing.  Fact Sheet for Patients: EntrepreneurPulse.com.au  Fact Sheet for Healthcare Providers: IncredibleEmployment.be  This test is not yet approved or cleared by the [place] FDA and has been authorized for detection and/or diagnosis of SARS-CoV-2 by FDA under an Emergency Use Authorization (EUA). This EUA will remain in effect (meaning this test can be used) for the duration of the COVID-19 declaration under Section 564(b)(1) of the Act, 21 U.S.C. section 360bbb-3(b)(1), unless the authorization is terminated or revoked.  Performed at [facility], [place] [address] [place], [place] [address]   C Difficile Quick Screen w PCR reflex     Status: None   Collection Time: 12/19/20 10:26 AM   Specimen: STOOL  Result Value Ref Range Status   C Diff antigen NEGATIVE NEGATIVE Final   C Diff toxin NEGATIVE NEGATIVE Final   C Diff interpretation No C. difficile detected.  Final    Comment: Performed at [facility], [place] [address], [place], [place] [address]    Pressure Injury 12/19/20 Sacrum Mid Stage 2 -  Partial thickness loss of dermis presenting as a shallow open injury with a red, pink wound bed without slough. (Active)  12/19/20 0000  Location: Sacrum  Location Orientation: Mid  Staging: Stage 2 -  Partial thickness loss of dermis presenting  as a shallow open injury with a red, pink wound bed without slough.  Wound Description (Comments):   Present on Admission: Yes     [place]   [facility] If 7PM-7AM, please contact night-coverage at [email or web address], Office  [phone]   12/19/2020, 4:30 PM  LOS: 1 day

## 2020-12-20 ENCOUNTER — Encounter (HOSPITAL_COMMUNITY): Payer: Self-pay | Admitting: Internal Medicine

## 2020-12-20 DIAGNOSIS — I455 Other specified heart block: Secondary | ICD-10-CM

## 2020-12-20 DIAGNOSIS — Z7189 Other specified counseling: Secondary | ICD-10-CM

## 2020-12-20 DIAGNOSIS — R531 Weakness: Secondary | ICD-10-CM

## 2020-12-20 DIAGNOSIS — Z515 Encounter for palliative care: Secondary | ICD-10-CM

## 2020-12-20 LAB — COMPREHENSIVE METABOLIC PANEL
ALT: 12 U/L (ref 0–44)
AST: 16 U/L (ref 15–41)
Albumin: 2.5 g/dL — ABNORMAL LOW (ref 3.5–5.0)
Alkaline Phosphatase: 92 U/L (ref 38–126)
Anion gap: 11 (ref 5–15)
BUN: 29 mg/dL — ABNORMAL HIGH (ref 8–23)
CO2: 23 mmol/L (ref 22–32)
Calcium: 8.1 mg/dL — ABNORMAL LOW (ref 8.9–10.3)
Chloride: 105 mmol/L (ref 98–111)
Creatinine, Ser: 0.87 mg/dL (ref 0.61–1.24)
GFR, Estimated: >60 mL/min (ref >60)
Glucose, Bld: 87 mg/dL (ref 70–99)
Potassium: 3.9 mmol/L (ref 3.5–5.1)
Sodium: 139 mmol/L (ref 135–145)
Total Bilirubin: 0.9 mg/dL (ref 0.3–1.2)
Total Protein: 6.4 g/dL — ABNORMAL LOW (ref 6.5–8.1)

## 2020-12-20 LAB — URINE CULTURE: Culture: NO GROWTH

## 2020-12-20 LAB — CBC
HCT: 33.9 % — ABNORMAL LOW (ref 39.0–52.0)
Hemoglobin: 10.7 g/dL — ABNORMAL LOW (ref 13.0–17.0)
MCH: 28.3 pg (ref 26.0–34.0)
MCHC: 31.6 g/dL (ref 30.0–36.0)
MCV: 89.7 fL (ref 80.0–100.0)
Platelets: 215 10*3/uL (ref 150–400)
RBC: 3.78 MIL/uL — ABNORMAL LOW (ref 4.22–5.81)
RDW: 15.9 % — ABNORMAL HIGH (ref 11.5–15.5)
WBC: 14.9 10*3/uL — ABNORMAL HIGH (ref 4.0–10.5)
nRBC: 0 % (ref 0.0–0.2)

## 2020-12-20 NOTE — Plan of Care (Signed)

## 2020-12-20 NOTE — Consult Note (Signed)
Consultation Note Date: 12/20/2020   Patient Name: Ryan Miles  DOB: Sep 21, 1946  MRN: [id number]  Age / Sex: 75 y.o., male  PCP: [name], MD Referring Physician: Oswald Hillock, MD  Reason for Consultation: Establishing goals of care  HPI/Patient Profile: 75 y.o. male    admitted on 12/18/2020  75 year old gentleman who was living alone, however more recently has been living with his daughter here in [place], [place], has most of his care at the [place], has served in [place], has chronic medical conditions of esophageal carcinoma, lung carcinoma hypertension hypothyroidism.  Patient presented with altered mental status.  Admitted for acute hypoxic respiratory failure deemed secondary to likely bilateral lower lobe pneumonia, questionable dysphagia, acute kidney injury on admission.  Hospital course complicated by patient requiring cardiology consultation for abnormal EKG.  Patient with questionable wandering atrial pacemaker, frequent PACs on EKG monitoring.  Patient with heart disease history of coronary artery disease, inferior STEMI with stent placement to right coronary artery in 2013, hypertension and dyslipidemia.  Clinical Assessment and Goals of Care: A palliative consultation has been requested for CODE STATUS and goals of care discussions.  Patient is resting in bed with eyes closed.  He appears very frail and cachectic.  His daughter Lanelle Bal is present at the bedside.  Introduced myself and palliative care as follows:  Palliative medicine is specialized medical care for people living with serious illness. It focuses on providing relief from the symptoms and stress of a serious illness. The goal is to improve quality of life for both the patient and the family.  Goals of care: Broad aims of medical therapy in relation to the patient's values and preferences. Our aim is to provide medical  care aimed at enabling patients to achieve the goals that matter most to them, given the circumstances of their particular medical situation and their constraints.   Goals wishes and values attempted to be ascertained.  Brief life review performed.  Patient is thanked for his service to the country having served in [place], he gets most of his care at the [place].  He was living by himself but was having some progressive functional decline and more recently has moved in with his daughter in [place], [place].  Patient ate about 50% of his breakfast today.  Overall, at times, he has had decreased appetite.  We discussed about goals of care.  We discussed about acute and chronic conditions impacting patient's overall condition.  Discussed that the patient remains in the hospital for treatment of infection as well as cardiac work-up.  Underlying issues of history of laryngeal cancer and lung cancer also discussed.  Discussed about monitoring carefully patient's functional status as well as his oral intake.  Briefly discussed about scope of palliative services.  At present patient is at home, has home-based occupational therapy and home-based nursing.  Patient's daughter wishes to continue the same at discharge.  NEXT OF KIN Patient is divorced, daughter Lanelle Bal.  SUMMARY OF RECOMMENDATIONS   Full code/full scope Patient likely has  advance care directives that he prepared through the [place], eliciting full code/full scope Monitor overall disease trajectory Recommend home with home health, home-based PT OT as well as consideration for home-based palliative care on discharge. Thank you for the consult.  Code Status/Advance Care Planning:  Full code    Symptom Management:      Palliative Prophylaxis:   Delirium Protocol  Additional Recommendations (Limitations, Scope, Preferences):  Full Scope Treatment  Psycho-social/Spiritual:   Desire for further Chaplaincy support:yes  Additional  Recommendations: Caregiving  Support/Resources  Prognosis:   Unable to determine  Discharge Planning: Home with Home Health: patient and daughter are open to thinking more about considering home based palliative care consultation after discharge.       Primary Diagnoses: Present on Admission: **None**   I have reviewed the medical record, interviewed the patient and family, and examined the patient. The following aspects are pertinent.  Past Medical History:  Diagnosis Date  . Coronary artery disease involving native coronary artery 03/2012   -100% RCA occluded-DES x2 [facility])  . 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 Hypertension   . Lung cancer ([place])   . Spinal stenosis   . ST elevation myocardial infarction (STEMI) of inferior wall ([place]) 03/2012   ([place]) 100% RCA-DES PCI x2 to RCA   Social History   Socioeconomic History  . Marital status: Divorced    Spouse name: Not on file  . Number of children: Not on file  . Years of education: Not on file  . Highest education level: Not on file  Occupational History  . Not on file  Tobacco Use  . Smoking status: Not on file  . Smokeless tobacco: Not on file  Vaping Use  . Vaping Use: Never used  Substance and Sexual Activity  . Alcohol use: Not on file  . Drug use: Not on file  . Sexual activity: Not on file  Other Topics Concern  . Not on file  Social History Narrative  . Not on file   Social Determinants of Health   Financial Resource Strain: Not on file  Food Insecurity: Not on file  Transportation Needs: Not on file  Physical Activity: Not on file  Stress: Not on file  Social Connections: Not on file   Family History  Family history unknown: Yes   Scheduled Meds: . aspirin EC  81 mg Oral Daily  . docusate sodium  100 mg Oral BID  . DULoxetine  20 mg Oral Daily  . enoxaparin (LOVENOX) injection  40 mg Subcutaneous Q24H  . feeding supplement  237 mL Oral BID  BM  . gabapentin  300 mg Oral TID  . guaiFENesin  600 mg Oral BID  . levothyroxine  88 mcg Oral Daily  . methadone  5 mg Oral QID  . senna  1 tablet Oral BID  . cyanocobalamin  1,000 mcg Oral Daily   Continuous Infusions: . azithromycin 500 mg (12/20/20 0808)  . cefTRIAXone (ROCEPHIN)  IV 2 g (12/20/20 0624)   PRN Meds:.acetaminophen **OR** acetaminophen, albuterol, bisacodyl, ondansetron **OR** ondansetron (ZOFRAN) IV, polyethylene glycol Medications Prior to Admission:  Prior to Admission medications   Medication Sig Start Date End Date Taking? Authorizing Provider  aspirin 81 MG EC tablet Take 81 mg by mouth daily. 06/09/12  Yes [provider]  cyanocobalamin 1000 MCG tablet Take 1,000 mcg by mouth daily. 06/20/20  Yes [provider]  DULoxetine (CYMBALTA) 20 MG capsule Take  20 mg by mouth daily. 11/10/20  Yes [provider]  feeding supplement (ENSURE ENLIVE / ENSURE PLUS) LIQD Take 237 mLs by mouth 2 (two) times daily between meals.   Yes [provider]  gabapentin (NEURONTIN) 300 MG capsule Take 300 mg by mouth 4 (four) times daily. 11/10/20  Yes [provider]  levothyroxine (SYNTHROID) 88 MCG tablet Take 88 mcg by mouth daily. 06/20/20  Yes [provider]  methadone (DOLOPHINE) 5 MG tablet Take 5 mg by mouth 4 (four) times daily. 11/28/20  Yes [provider]  metoprolol tartrate (LOPRESSOR) 25 MG tablet Take 25 mg by mouth 2 (two) times daily. 12/24/19  Yes [provider]  ramipril (ALTACE) 5 MG capsule Take 5 mg by mouth daily. 04/02/20  Yes [provider]  senna (SENOKOT) 8.6 MG tablet Take 3-4 tablets by mouth at bedtime.   Yes [provider]   Allergies  Allergen Reactions  . Rosuvastatin     Other reaction(s): Headache, Other (See Comments) Entire body aches.  . Atorvastatin     Other reaction(s): Muscle Pain  . Cephalexin     Other reaction(s): Other (See Comments) "fuzzy mouth"   . Penicillins     Other reaction(s): Unknown   Review of Systems Denies pain  Physical Exam Frail elderly gentleman Initially resting in bed with eyes closed, awakens easily, responds appropriately to questions asked.  Is able to mouth words effectively and make his needs known Has post laryngectomy stoma through which he is getting supplemental oxygen S1-S2 Does not appear dyspneic Abdomen is not distended Patient is thin and has cachexia muscle wasting  Vital Signs: BP (!) 135/59 (BP Location: Right Arm)   Pulse (!) 115   Temp 98.4 F (36.9 C) (Oral)   Resp 18   Ht 5\' 7"  (1.702 m)   Wt 62.1 kg   SpO2 99%   BMI 21.46 kg/m  Pain Scale: 0-10   Pain Score: 0-No pain   SpO2: SpO2: 99 % O2 Device:SpO2: 99 % O2 Flow Rate: .O2 Flow Rate (L/min): 8 L/min  IO: Intake/output summary:   Intake/Output Summary (Last 24 hours) at 12/20/2020 1129 Last data filed at 12/19/2020 2015 Gross per 24 hour  Intake 237 ml  Output 100 ml  Net 137 ml   Palliative performance scale 50%. LBM: Last BM Date: 12/19/20 Baseline Weight: Weight: 62.1 kg Most recent weight: Weight: 62.1 kg     Palliative Assessment/Data:     Time In: 10 Time Out:  11 Time Total:  60 Greater than 50%  of this time was spent counseling and coordinating care related to the above assessment and plan.  Signed by: Loistine Chance, MD   Please contact Palliative Medicine Team phone at [phone] for questions and concerns.  For individual provider: See Shea Evans

## 2020-12-20 NOTE — Progress Notes (Signed)
Pt on telemetry noted to have a 4.90 second pause. Pt reassessed and no acute changes in Pt's assessment noted. VSS and Pt denies any c/o pain or other needs. MD updated. Maintain current plan of care and monitor Pt closley

## 2020-12-20 NOTE — Progress Notes (Addendum)
Progress Note  Patient Name: Ryan Miles Date of Encounter: 12/20/2020  [facility] Cardiologist: None.  Followed by Ryan Ned, MD ([facility])  Subjective   Laying in bed.  Does not reply to questions.  Very cachectic appearing  Inpatient Medications    Scheduled Meds: . aspirin EC  81 mg Oral Daily  . docusate sodium  100 mg Oral BID  . DULoxetine  20 mg Oral Daily  . enoxaparin (LOVENOX) injection  40 mg Subcutaneous Q24H  . feeding supplement  237 mL Oral BID BM  . gabapentin  300 mg Oral TID  . guaiFENesin  600 mg Oral BID  . levothyroxine  88 mcg Oral Daily  . methadone  5 mg Oral QID  . senna  1 tablet Oral BID  . cyanocobalamin  1,000 mcg Oral Daily   Continuous Infusions: . azithromycin 500 mg (12/20/20 0808)  . cefTRIAXone (ROCEPHIN)  IV 2 g (12/20/20 0624)   PRN Meds: acetaminophen **OR** acetaminophen, albuterol, bisacodyl, ondansetron **OR** ondansetron (ZOFRAN) IV, polyethylene glycol   Vital Signs    Vitals:   12/19/20 2056 12/19/20 2344 12/20/20 0424 12/20/20 0801  BP:   (!) 135/59   Pulse:  80 63 62  Resp:   16 (!) 25  Temp:   98.4 F (36.9 C)   TempSrc:   Oral   SpO2: 100% 98% 96% 94%  Weight:      Height:        Intake/Output Summary (Last 24 hours) at 12/20/2020 1044 Last data filed at 12/19/2020 2015 Gross per 24 hour  Intake 237 ml  Output 100 ml  Net 137 ml   Last 3 Weights 12/18/2020  Weight (lbs) 137 lb  Weight (kg) 62.143 kg      Telemetry    NSR with frequent PACs, several pauses up to 3 seconds and nonsustained atrial tachycardia - Personally Reviewed  ECG    NSR with iLBBB - Personally Reviewed  Physical Exam   TML:YYTK cachectic and ill appearing Neck: No JVD Cardiac: RRR, no  rubs, or gallops. 2/6 SM at LLSB to apex Respiratory: Clear to auscultation bilaterally. GI: Soft, nontender, non-distended  MS: No edema; No deformity. Neuro:  Nonfocal  Psych: Normal affect   Labs    High  Sensitivity Troponin:  No results for input(s): TROPONINIHS in the last 720 hours.    Chemistry Recent Labs  Lab 12/18/20 0449 12/18/20 0708 12/18/20 2256 12/19/20 0507 12/20/20 0550  NA QUESTIONABLE IDENTIFICATION / INCORRECTLY LABELED SPECIMEN 135  --  138 139  K QUESTIONABLE IDENTIFICATION / INCORRECTLY LABELED SPECIMEN 4.5  --  3.9 3.9  CL QUESTIONABLE IDENTIFICATION / INCORRECTLY LABELED SPECIMEN 99  --  106 105  CO2 QUESTIONABLE IDENTIFICATION / INCORRECTLY LABELED SPECIMEN 24  --  22 23  GLUCOSE QUESTIONABLE IDENTIFICATION / INCORRECTLY LABELED SPECIMEN 108*  --  102* 87  BUN QUESTIONABLE IDENTIFICATION / INCORRECTLY LABELED SPECIMEN 64*  --  48* 29*  CREATININE QUESTIONABLE IDENTIFICATION / INCORRECTLY LABELED SPECIMEN 1.65* 1.10 1.04 0.87  CALCIUM QUESTIONABLE IDENTIFICATION / INCORRECTLY LABELED SPECIMEN 8.4*  --  7.8* 8.1*  PROT QUESTIONABLE IDENTIFICATION / INCORRECTLY LABELED SPECIMEN 7.6  --  5.9* 6.4*  ALBUMIN QUESTIONABLE IDENTIFICATION / INCORRECTLY LABELED SPECIMEN 3.0*  --  2.2* 2.5*  AST QUESTIONABLE IDENTIFICATION / INCORRECTLY LABELED SPECIMEN 18  --  16 16  ALT QUESTIONABLE IDENTIFICATION / INCORRECTLY LABELED SPECIMEN 13  --  13 12  ALKPHOS QUESTIONABLE IDENTIFICATION / INCORRECTLY LABELED SPECIMEN 97  --  79 92  BILITOT QUESTIONABLE IDENTIFICATION / INCORRECTLY LABELED SPECIMEN 1.6*  --  1.0 0.9  GFRNONAA QUESTIONABLE IDENTIFICATION / INCORRECTLY LABELED SPECIMEN 43* >60 >60 >60  GFRAA QUESTIONABLE IDENTIFICATION / INCORRECTLY LABELED SPECIMEN  --   --   --   --   ANIONGAP QUESTIONABLE IDENTIFICATION / INCORRECTLY LABELED SPECIMEN 12  --  10 11     Hematology Recent Labs  Lab 12/18/20 2256 12/19/20 0507 12/20/20 0550  WBC 25.7* 19.7* 14.9*  RBC 3.86* 3.33* 3.78*  HGB 10.9* 9.5* 10.7*  HCT 34.1* 29.6* 33.9*  MCV 88.3 88.9 89.7  MCH 28.2 28.5 28.3  MCHC 32.0 32.1 31.6  RDW 15.9* 15.9* 15.9*  PLT 227 200 215    BNPNo results for input(s): BNP,  PROBNP in the last 168 hours.   DDimer No results for input(s): DDIMER in the last 168 hours.   Radiology    US RENAL  Result Date: 12/18/2020 CLINICAL DATA:  Acute kidney injury. EXAM: RENAL / URINARY TRACT ULTRASOUND COMPLETE COMPARISON:  None. FINDINGS: Right Kidney: Renal measurements: 8.1 x 3.7 x 4.0 cm = volume: 62 mL. Echogenicity within normal limits. No mass or hydronephrosis visualized. Left Kidney: Not visualized due to overlying bowel gas. Bladder: Appears normal for degree of bladder distention. Other: None. IMPRESSION: Normal right kidney. Left kidney not visualized due to overlying bowel gas. Electronically Signed   By: Ryan Miles M.D.   On: 12/18/2020 15:29    Cardiac Studies   none  Patient Profile     75 y.o. 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  Ryan Miles is being seen for the evaluation of Abnormal EKG/Telemetry (sometimes going slow and sometimes going fast) at the request of Ryan Hillock, MD.  Ryan Miles    1.  Wandering atrial pacemaker -tele reviewed showing frequent PACs, nonsustained atrial tachycardia vs. MAT, wandering atrial pacemaker and some pauses up to 3 seconds -nothing on tele to indicate atrial fibrillation -consider outpt event monitor to assess further  2.  ASCAD -denies any anginal symptoms -continue ASA -statin intolerant -BB on hold due to lung issues>>given pauses of tele would not restart  3.  Bilateral upper lobe CAP -on antibx per Ryan Miles will sign off.   Medication Recommendations:  ASA 81mg  daily, Ramipril 5mg  daily.   Other recommendations (labs, testing, etc):  [facility] as [facility]:  followup with Cardiology at [facility] with event  monitor  For questions or updates, please contact Ryan Miles Please consult [email or web address] for contact info under        Signed, Ryan Him, MD  12/20/2020, 10:44 AM

## 2020-12-20 NOTE — Progress Notes (Signed)
[facility]  PROGRESS NOTE  Ryan Miles WJX:[id number] DOB: 19-Jan-1946 DOA: 12/18/2020 PCP: [name], MD   Brief HPI:   75 year old male with medical history of esophageal carcinoma, lung carcinoma, hypertension, hypothyroidism presents with altered mental status.  As per daughter patient has been having intermittent episodes of confusion and increasing weakness for past few days.  Also has been talking nonsensical at times.  He was supposed to have a OT visit but was having generalized weakness that was concerning.  Therapist told that it was possible UTI which was causing all the symptoms.  Patient fell out of bed so EMS was called and he was brought to ED.  In the ED patient was found to be hypoxemic on room air, found to have bilateral pneumonia and AKI.  Started on IV antibiotics and IV fluids.    Subjective   Patient seen and examined, complains of pain in the buttocks due to positioning in the bed.  Wants position to be changed.  Denies chest pain or shortness of breath.  WBC is improving.   Assessment/Plan:     1. Acute hypoxemic respiratory failure-secondary to bilateral lower lobe pneumonia, currently requiring 3 L/min oxygen via nonrebreather mask.  Blood culture is negative to date.  Follow urine for Legionella, strep pneumo urinary antigen.  Continue Rocephin and Zithromax.  Consulted respiratory therapy to manage secretions. 2. Altered mental status-resolved, likely from above.  Significantly improved.  CT head was negative. 3. Questionable dysphagia-concern for aspiration pneumonia, speech therapy was consulted for swallow evaluation, patient has refused MBS and does not want PEG tube placement.  Palliative care consulted for further clarification of goals of care.  4. Acute kidney injury-creatinine is 1.65 on admission, has improved with IV fluids.  Today creatinine is 1.04.  Renal ultrasound shows no acute abnormality. 5. Hypertension-blood pressure is soft,  home blood pressure medications including metoprolol, ramipril are on hold. 6. Hypothyroidism-continue Synthroid 7. Constipation-continue home regimen including Colace, MiraLAX. 8. History of esophageal cancer/s/p laryngectomy-patient has posterior injecting stoma, will consult respiratory therapy for management of secretions. 9. Sinus pauses/wandering pacemaker/atrial bigeminy-patient's telemetry shows sinus pauses, atrial bigeminy, wandering pacemaker.  Cardiology was consulted for further recommendations.  At this time cardiology recommends outpatient event monitor to further assess.  No atrial fibrillation noted on telemetry.     COVID-19 Labs  No results for input(s): DDIMER, FERRITIN, LDH, CRP in the last 72 hours.  Lab Results  Component Value Date   [place] NEGATIVE 12/18/2020   [place]  12/18/2020    QUESTIONABLE IDENTIFICATION / INCORRECTLY LABELED SPECIMEN     Scheduled medications:   . aspirin EC  81 mg Oral Daily  . docusate sodium  100 mg Oral BID  . DULoxetine  20 mg Oral Daily  . enoxaparin (LOVENOX) injection  40 mg Subcutaneous Q24H  . feeding supplement  237 mL Oral BID BM  . gabapentin  300 mg Oral TID  . guaiFENesin  600 mg Oral BID  . levothyroxine  88 mcg Oral Daily  . methadone  5 mg Oral QID  . senna  1 tablet Oral BID  . cyanocobalamin  1,000 mcg Oral Daily       Data Reviewed:   CBG: No results for input(s): GLUCAP in the last 168 hours.  SpO2: 99 % O2 Flow Rate (L/min): 8 L/min FiO2 (%): 35 %      CBC:  Recent Labs  Lab 12/18/20 0449 12/18/20 0708 12/18/20 2256 12/19/20 0507 12/20/20 0550  WBC  QUESTIONABLE IDENTIFICATION / INCORRECTLY LABELED SPECIMEN 33.0* 25.7* 19.7* 14.9*  HGB QUESTIONABLE IDENTIFICATION / INCORRECTLY LABELED SPECIMEN 11.5* 10.9* 9.5* 10.7*  HCT QUESTIONABLE IDENTIFICATION / INCORRECTLY LABELED SPECIMEN 35.7* 34.1* 29.6* 33.9*  PLT QUESTIONABLE IDENTIFICATION / INCORRECTLY LABELED SPECIMEN 236 227 200  215  MCV QUESTIONABLE IDENTIFICATION / INCORRECTLY LABELED SPECIMEN 87.1 88.3 88.9 89.7  MCH QUESTIONABLE IDENTIFICATION / INCORRECTLY LABELED SPECIMEN 28.0 28.2 28.5 28.3  MCHC QUESTIONABLE IDENTIFICATION / INCORRECTLY LABELED SPECIMEN 32.2 32.0 32.1 31.6  RDW QUESTIONABLE IDENTIFICATION / INCORRECTLY LABELED SPECIMEN 15.8* 15.9* 15.9* 15.9*  LYMPHSABS QUESTIONABLE IDENTIFICATION / INCORRECTLY LABELED SPECIMEN 0.7  --   --   --   MONOABS QUESTIONABLE IDENTIFICATION / INCORRECTLY LABELED SPECIMEN 0.7  --   --   --   EOSABS QUESTIONABLE IDENTIFICATION / INCORRECTLY LABELED SPECIMEN 0.0  --   --   --   BASOSABS QUESTIONABLE IDENTIFICATION / INCORRECTLY LABELED SPECIMEN 0.0  --   --   --     Complete metabolic panel:  Recent Labs  Lab 12/18/20 0449 12/18/20 0625 12/18/20 0708 12/18/20 0905 12/18/20 2256 12/19/20 0507 12/20/20 0550  NA QUESTIONABLE IDENTIFICATION / INCORRECTLY LABELED SPECIMEN  --  135  --   --  138 139  K QUESTIONABLE IDENTIFICATION / INCORRECTLY LABELED SPECIMEN  --  4.5  --   --  3.9 3.9  CL QUESTIONABLE IDENTIFICATION / INCORRECTLY LABELED SPECIMEN  --  99  --   --  106 105  CO2 QUESTIONABLE IDENTIFICATION / INCORRECTLY LABELED SPECIMEN  --  24  --   --  22 23  GLUCOSE QUESTIONABLE IDENTIFICATION / INCORRECTLY LABELED SPECIMEN  --  108*  --   --  102* 87  BUN QUESTIONABLE IDENTIFICATION / INCORRECTLY LABELED SPECIMEN  --  64*  --   --  48* 29*  CREATININE QUESTIONABLE IDENTIFICATION / INCORRECTLY LABELED SPECIMEN  --  1.65*  --  1.10 1.04 0.87  CALCIUM QUESTIONABLE IDENTIFICATION / INCORRECTLY LABELED SPECIMEN  --  8.4*  --   --  7.8* 8.1*  AST QUESTIONABLE IDENTIFICATION / INCORRECTLY LABELED SPECIMEN  --  18  --   --  16 16  ALT QUESTIONABLE IDENTIFICATION / INCORRECTLY LABELED SPECIMEN  --  13  --   --  13 12  ALKPHOS QUESTIONABLE IDENTIFICATION / INCORRECTLY LABELED SPECIMEN  --  97  --   --  79 92  BILITOT QUESTIONABLE IDENTIFICATION / INCORRECTLY LABELED  SPECIMEN  --  1.6*  --   --  1.0 0.9  ALBUMIN QUESTIONABLE IDENTIFICATION / INCORRECTLY LABELED SPECIMEN  --  3.0*  --   --  2.2* 2.5*  LATICACIDVEN  --  QUESTIONABLE IDENTIFICATION / INCORRECTLY LABELED SPECIMEN 1.9 1.7  --   --   --   INR  --   --  1.3*  --   --   --   --     No results for input(s): LIPASE, AMYLASE in the last 168 hours.  Recent Labs  Lab 12/18/20 0435 12/18/20 1127  [place] IDENTIFICATION / INCORRECTLY LABELED SPECIMEN NEGATIVE    ------------------------------------------------------------------------------------------------------------------  Coagulation profile  Recent Labs  Lab 12/18/20 0708  INR 1.3*       Radiology Reports  US RENAL  Result Date: 12/18/2020 CLINICAL DATA:  Acute kidney injury. EXAM: RENAL / URINARY TRACT ULTRASOUND COMPLETE COMPARISON:  None. FINDINGS: Right Kidney: Renal measurements: 8.1 x 3.7 x 4.0 cm = volume: 62 mL. Echogenicity within normal limits. No mass or hydronephrosis visualized. Left Kidney:  Not visualized due to overlying bowel gas. Bladder: Appears normal for degree of bladder distention. Other: None. IMPRESSION: Normal right kidney. Left kidney not visualized due to overlying bowel gas. Electronically Signed   By: Marijo Conception M.D.   On: 12/18/2020 15:29      Antibiotics: Anti-infectives (From admission, onward)   Start     Dose/Rate Route Frequency Ordered Stop   12/18/20 0645  cefTRIAXone (ROCEPHIN) 2 g in sodium chloride 0.9 % 100 mL IVPB        2 g 200 mL/hr over 30 Minutes Intravenous Every 24 hours 12/18/20 0641     12/18/20 0645  azithromycin (ZITHROMAX) 500 mg in sodium chloride 0.9 % 250 mL IVPB        500 mg 250 mL/hr over 60 Minutes Intravenous Every 24 hours 12/18/20 0641         DVT prophylaxis: Lovenox  Code Status: Full code  Family Communication: Discussed with patient's daughter at bedside   Consultants:    Procedures:      Objective   Vitals:    12/20/20 0424 12/20/20 0801 12/20/20 1109 12/20/20 1135  BP: (!) 135/59   (!) 123/49  Pulse: 63 62 (!) 115 98  Resp: 16 (!) 25 18 18   Temp: 98.4 F (36.9 C)   98.2 F (36.8 C)  TempSrc: Oral   Oral  SpO2: 96% 94% 99% 99%  Weight:      Height:        Intake/Output Summary (Last 24 hours) at 12/20/2020 1318 Last data filed at 12/19/2020 2015 Gross per 24 hour  Intake -  Output 100 ml  Net -100 ml    03/17 1901 - 03/19 0700 In: 802.7 [P.O.:357] Out: 550 [Urine:550]  Filed Weights   12/18/20 0912  Weight: 62.1 kg    Physical Examination:   General-appears in no acute distress Heart-S1-S2, regular, no murmur auscultated HEENT-post laryngectomy stoma noted Lungs-clear to auscultation bilaterally, no wheezing or crackles auscultated Abdomen-soft, nontender, no organomegaly Extremities-no edema in the lower extremities Neuro-alert, oriented x3, no focal deficit noted  Status is: Inpatient  Dispo: The patient is from: Home              Anticipated d/c is to: To be decided              Anticipated d/c date is: 12/24/2020              Patient currently not stable for discharge  Barrier to discharge-ongoing treatment for acute hypoxemic respiratory failure  Microbiology  Recent Results (from the past 240 hour(s))  Resp Panel by RT-PCR (Flu A&B, Covid) Urine, Catheterized     Status: None   Collection Time: 12/18/20  4:35 AM   Specimen: Urine, Catheterized; Nasopharyngeal(NP) swabs in vial transport medium  Result Value Ref Range Status   SARS Coronavirus 2 by RT PCR  NEGATIVE Corrected    QUESTIONABLE IDENTIFICATION / INCORRECTLY LABELED SPECIMEN    Comment: CORRECTED ON 03/17 AT 0656: PREVIOUSLY REPORTED AS NEGATIVE   Influenza A by PCR  NEGATIVE Corrected    QUESTIONABLE IDENTIFICATION / INCORRECTLY LABELED SPECIMEN    Comment: CORRECTED ON 03/17 AT 0865: PREVIOUSLY REPORTED AS NEGATIVE   Influenza B by PCR  NEGATIVE Corrected    QUESTIONABLE IDENTIFICATION /  INCORRECTLY LABELED SPECIMEN    Comment: Performed at [facility], [place] [address], [place], [place] [address] CORRECTED ON 03/17 AT 6295: PREVIOUSLY REPORTED AS NEGATIVE   Urine culture  Status: None   Collection Time: 12/18/20  6:41 AM   Specimen: In/Out Cath Urine  Result Value Ref Range Status   Specimen Description   Final    IN/OUT CATH URINE Performed at [facility], [place] [address], [place], [place] [address]    Special Requests   Final    NONE Performed at [facility], [place] [address] [place], [place] [address]    Culture   Final    NO GROWTH Performed at [facility], [place] [address] [place], [place] [address]    Report Status 12/20/2020 FINAL  Final  Blood Culture (routine x 2)     Status: None (Preliminary result)   Collection Time: 12/18/20  7:05 AM   Specimen: Right Antecubital; Blood  Result Value Ref Range Status   Specimen Description   Final    RIGHT ANTECUBITAL Performed at [place] [address], [place], [place] [address]    Special Requests   Final    BOTTLES DRAWN AEROBIC AND ANAEROBIC Blood Culture adequate volume Performed at [place] [address], [place], [place] [address]    Culture   Final    NO GROWTH 2 DAYS Performed at [place] [address], [place], [place] [address]    Report Status PENDING  Incomplete  Blood Culture (routine x 2)     Status: None (Preliminary result)   Collection Time: 12/18/20  7:08 AM   Specimen: Left Antecubital; Blood  Result Value Ref Range Status   Specimen Description   Final    LEFT ANTECUBITAL Performed at [place] [address], [place], [place] [address]    Special Requests   Final    BOTTLES DRAWN AEROBIC AND ANAEROBIC Blood Culture adequate volume Performed at [facility] [address] [place], [place] [address]    Culture    Final    NO GROWTH 2 DAYS Performed at [facility] [address], [place], [place] [address]    Report Status PENDING  Incomplete  Resp Panel by RT-PCR (Flu A&B, Covid) Nasopharyngeal Swab     Status: None   Collection Time: 12/18/20 11:27 AM   Specimen: Nasopharyngeal Swab; Nasopharyngeal(NP) swabs in vial transport medium  Result Value Ref Range Status   SARS Coronavirus 2 by RT PCR NEGATIVE NEGATIVE Final    Comment: (NOTE) SARS-CoV-2 target nucleic acids are NOT DETECTED.        Influenza A by PCR NEGATIVE NEGATIVE Final   Influenza B by PCR NEGATIVE NEGATIVE Final    Comment: (NOTE)    C Difficile Quick Screen w PCR reflex     Status: None   Collection Time: 12/19/20 10:26 AM   Specimen: STOOL  Result Value Ref Range Status   C Diff antigen NEGATIVE NEGATIVE Final   C Diff toxin NEGATIVE NEGATIVE Final   C Diff interpretation No C. difficile detected.  Final    Comment: Performed at [facility], [place] [address], [place], [place] [address]    Pressure Injury 12/19/20 Sacrum Mid Stage 2 -  Partial thickness loss of dermis presenting as a shallow open injury with a red, pink wound bed without slough. (Active)  12/19/20 0000  Location: Sacrum  Location Orientation: Mid  Staging: Stage 2 -  Partial thickness loss of dermis presenting as a shallow open injury with a red, pink wound bed without slough.  Wound Description (  Comments):   Present on Admission: Yes     [place]   [facility] If 7PM-7AM, please contact night-coverage at [email or web address], Office  [phone]   12/20/2020, 1:18 PM  LOS: 2 days

## 2020-12-21 DIAGNOSIS — I498 Other specified cardiac arrhythmias: Secondary | ICD-10-CM

## 2020-12-21 DIAGNOSIS — I491 Atrial premature depolarization: Secondary | ICD-10-CM

## 2020-12-21 DIAGNOSIS — I2510 Atherosclerotic heart disease of native coronary artery without angina pectoris: Secondary | ICD-10-CM

## 2020-12-21 LAB — CBC
HCT: 34.2 % — ABNORMAL LOW (ref 39.0–52.0)
Hemoglobin: 10.7 g/dL — ABNORMAL LOW (ref 13.0–17.0)
MCH: 28.1 pg (ref 26.0–34.0)
MCHC: 31.3 g/dL (ref 30.0–36.0)
MCV: 89.8 fL (ref 80.0–100.0)
Platelets: 240 10*3/uL (ref 150–400)
RBC: 3.81 MIL/uL — ABNORMAL LOW (ref 4.22–5.81)
RDW: 16 % — ABNORMAL HIGH (ref 11.5–15.5)
WBC: 11.8 10*3/uL — ABNORMAL HIGH (ref 4.0–10.5)
nRBC: 0 % (ref 0.0–0.2)

## 2020-12-21 LAB — COMPREHENSIVE METABOLIC PANEL
ALT: 17 U/L (ref 0–44)
AST: 21 U/L (ref 15–41)
Albumin: 2.3 g/dL — ABNORMAL LOW (ref 3.5–5.0)
Alkaline Phosphatase: 136 U/L — ABNORMAL HIGH (ref 38–126)
Anion gap: 10 (ref 5–15)
BUN: 18 mg/dL (ref 8–23)
CO2: 24 mmol/L (ref 22–32)
Calcium: 7.9 mg/dL — ABNORMAL LOW (ref 8.9–10.3)
Chloride: 104 mmol/L (ref 98–111)
Creatinine, Ser: 0.58 mg/dL — ABNORMAL LOW (ref 0.61–1.24)
GFR, Estimated: >60 mL/min (ref >60)
Glucose, Bld: 82 mg/dL (ref 70–99)
Potassium: 3.9 mmol/L (ref 3.5–5.1)
Sodium: 138 mmol/L (ref 135–145)
Total Bilirubin: 0.8 mg/dL (ref 0.3–1.2)
Total Protein: 6 g/dL — ABNORMAL LOW (ref 6.5–8.1)

## 2020-12-21 MED ORDER — SODIUM CHLORIDE 0.9 % IV SOLN
INTRAVENOUS | Status: DC | PRN
  Administered 2020-12-21: 250 mL via INTRAVENOUS

## 2020-12-21 NOTE — Progress Notes (Signed)
PT denies need for stoma suctioning at this time. PT was 100% on 40% ATC- decreased Fi02 to 35%.

## 2020-12-21 NOTE — Progress Notes (Signed)
At the 8 PM hour, MEWS score now green. However, will continue to monitor vitals Q 4 hours due to previous Yellow MEWS from elevated HR.

## 2020-12-21 NOTE — Plan of Care (Signed)
  Problem: Education: Goal: Knowledge of General Education information will improve Description Including pain rating scale, medication(s)/side effects and non-pharmacologic comfort measures Outcome: Progressing   

## 2020-12-21 NOTE — Progress Notes (Signed)
[facility]  PROGRESS NOTE  Ryan Miles GBT:[id number] DOB: 06-May-1946 DOA: 12/18/2020 PCP: [name], MD   Brief HPI:   75 year old male with medical history of esophageal carcinoma, lung carcinoma, hypertension, hypothyroidism presents with altered mental status.  As per daughter patient has been having intermittent episodes of confusion and increasing weakness for past few days.  Also has been talking nonsensical at times.  He was supposed to have a OT visit but was having generalized weakness that was concerning.  Therapist told that it was possible UTI which was causing all the symptoms.  Patient fell out of bed so EMS was called and he was brought to ED.  In the ED patient was found to be hypoxemic on room air, found to have bilateral pneumonia and AKI.  Started on IV antibiotics and IV fluids.    Subjective   Patient seen and examined, denies shortness of breath.  Still requiring 10 L of oxygen.   Assessment/Plan:     1. Acute hypoxemic respiratory failure-secondary to bilateral lower lobe pneumonia, currently requiring 10 L/min oxygen via blow-by oxygen across post laryngectomy stoma.  Blood culture is negative to date.  Follow urine for Legionella, strep pneumo urinary antigen.  Continue Rocephin and Zithromax.  Consulted respiratory therapy to manage secretions.  WBC is down to 11.8. 2. Altered mental status-resolved, likely from above.  Significantly improved.  CT head was negative. 3. Questionable dysphagia-concern for aspiration pneumonia, speech therapy was consulted for swallow evaluation, patient has refused MBS and does not want PEG tube placement.  Appreciate palliative care consult, continue current management.    4. Acute kidney injury-creatinine is 1.65 on admission, has improved with IV fluids.  Today creatinine is 1.04.  Renal ultrasound shows no acute abnormality. 5. Hypertension-blood pressure is soft, home blood pressure medications including  metoprolol, ramipril are on hold. 6. Hypothyroidism-continue Synthroid 7. Constipation-continue home regimen including Colace, MiraLAX. 8. History of esophageal cancer/s/p laryngectomy-patient has posterior injecting stoma, will consult respiratory therapy for management of secretions. 9. Sinus pauses/wandering pacemaker/atrial bigeminy-patient's telemetry shows sinus pauses, atrial bigeminy, wandering pacemaker.  Cardiology was consulted for further recommendations.  At this time cardiology recommends outpatient event monitor to further assess.  No atrial fibrillation noted on telemetry.     COVID-19 Labs  No results for input(s): DDIMER, FERRITIN, LDH, CRP in the last 72 hours.  Lab Results  Component Value Date   [place] NEGATIVE 12/18/2020   [place]  12/18/2020    QUESTIONABLE IDENTIFICATION / INCORRECTLY LABELED SPECIMEN     Scheduled medications:   . aspirin EC  81 mg Oral Daily  . docusate sodium  100 mg Oral BID  . DULoxetine  20 mg Oral Daily  . enoxaparin (LOVENOX) injection  40 mg Subcutaneous Q24H  . feeding supplement  237 mL Oral BID BM  . gabapentin  300 mg Oral TID  . guaiFENesin  600 mg Oral BID  . levothyroxine  88 mcg Oral Daily  . methadone  5 mg Oral QID  . senna  1 tablet Oral BID  . cyanocobalamin  1,000 mcg Oral Daily       Data Reviewed:   CBG: No results for input(s): GLUCAP in the last 168 hours.  SpO2: 90 % O2 Flow Rate (L/min): 10 L/min FiO2 (%): 40 %      CBC:  Recent Labs  Lab 12/18/20 0449 12/18/20 0708 12/18/20 2256 12/19/20 0507 12/20/20 0550 12/21/20 0555  WBC QUESTIONABLE IDENTIFICATION / INCORRECTLY LABELED SPECIMEN 33.0* 25.7*  19.7* 14.9* 11.8*  HGB QUESTIONABLE IDENTIFICATION / INCORRECTLY LABELED SPECIMEN 11.5* 10.9* 9.5* 10.7* 10.7*  HCT QUESTIONABLE IDENTIFICATION / INCORRECTLY LABELED SPECIMEN 35.7* 34.1* 29.6* 33.9* 34.2*  PLT QUESTIONABLE IDENTIFICATION / INCORRECTLY LABELED SPECIMEN 236 227 200 215 240   MCV QUESTIONABLE IDENTIFICATION / INCORRECTLY LABELED SPECIMEN 87.1 88.3 88.9 89.7 89.8  MCH QUESTIONABLE IDENTIFICATION / INCORRECTLY LABELED SPECIMEN 28.0 28.2 28.5 28.3 28.1  MCHC QUESTIONABLE IDENTIFICATION / INCORRECTLY LABELED SPECIMEN 32.2 32.0 32.1 31.6 31.3  RDW QUESTIONABLE IDENTIFICATION / INCORRECTLY LABELED SPECIMEN 15.8* 15.9* 15.9* 15.9* 16.0*  LYMPHSABS QUESTIONABLE IDENTIFICATION / INCORRECTLY LABELED SPECIMEN 0.7  --   --   --   --   MONOABS QUESTIONABLE IDENTIFICATION / INCORRECTLY LABELED SPECIMEN 0.7  --   --   --   --   EOSABS QUESTIONABLE IDENTIFICATION / INCORRECTLY LABELED SPECIMEN 0.0  --   --   --   --   BASOSABS QUESTIONABLE IDENTIFICATION / INCORRECTLY LABELED SPECIMEN 0.0  --   --   --   --     Complete metabolic panel:  Recent Labs  Lab 12/18/20 0449 12/18/20 0625 12/18/20 0708 12/18/20 0905 12/18/20 2256 12/19/20 0507 12/20/20 0550 12/21/20 0555  NA QUESTIONABLE IDENTIFICATION / INCORRECTLY LABELED SPECIMEN  --  135  --   --  138 139 138  K QUESTIONABLE IDENTIFICATION / INCORRECTLY LABELED SPECIMEN  --  4.5  --   --  3.9 3.9 3.9  CL QUESTIONABLE IDENTIFICATION / INCORRECTLY LABELED SPECIMEN  --  99  --   --  106 105 104  CO2 QUESTIONABLE IDENTIFICATION / INCORRECTLY LABELED SPECIMEN  --  24  --   --  22 23 24   GLUCOSE QUESTIONABLE IDENTIFICATION / INCORRECTLY LABELED SPECIMEN  --  108*  --   --  102* 87 82  BUN QUESTIONABLE IDENTIFICATION / INCORRECTLY LABELED SPECIMEN  --  64*  --   --  48* 29* 18  CREATININE QUESTIONABLE IDENTIFICATION / INCORRECTLY LABELED SPECIMEN  --  1.65*  --  1.10 1.04 0.87 0.58*  CALCIUM QUESTIONABLE IDENTIFICATION / INCORRECTLY LABELED SPECIMEN  --  8.4*  --   --  7.8* 8.1* 7.9*  AST QUESTIONABLE IDENTIFICATION / INCORRECTLY LABELED SPECIMEN  --  18  --   --  16 16 21   ALT QUESTIONABLE IDENTIFICATION / INCORRECTLY LABELED SPECIMEN  --  13  --   --  13 12 17   ALKPHOS QUESTIONABLE IDENTIFICATION / INCORRECTLY LABELED SPECIMEN   --  97  --   --  79 92 136*  BILITOT QUESTIONABLE IDENTIFICATION / INCORRECTLY LABELED SPECIMEN  --  1.6*  --   --  1.0 0.9 0.8  ALBUMIN QUESTIONABLE IDENTIFICATION / INCORRECTLY LABELED SPECIMEN  --  3.0*  --   --  2.2* 2.5* 2.3*  LATICACIDVEN  --  QUESTIONABLE IDENTIFICATION / INCORRECTLY LABELED SPECIMEN 1.9 1.7  --   --   --   --   INR  --   --  1.3*  --   --   --   --   --     No results for input(s): LIPASE, AMYLASE in the last 168 hours.  Recent Labs  Lab 12/18/20 0435 12/18/20 1127  [place] IDENTIFICATION / INCORRECTLY LABELED SPECIMEN NEGATIVE    ------------------------------------------------------------------------------------------------------------------  Coagulation profile  Recent Labs  Lab 12/18/20 0708  INR 1.3*       Radiology Reports  No results found.    Antibiotics: Anti-infectives (From admission, onward)   Start  Dose/Rate Route Frequency Ordered Stop   12/18/20 0645  cefTRIAXone (ROCEPHIN) 2 g in sodium chloride 0.9 % 100 mL IVPB        2 g 200 mL/hr over 30 Minutes Intravenous Every 24 hours 12/18/20 0641     12/18/20 0645  azithromycin (ZITHROMAX) 500 mg in sodium chloride 0.9 % 250 mL IVPB        500 mg 250 mL/hr over 60 Minutes Intravenous Every 24 hours 12/18/20 0641         DVT prophylaxis: Lovenox  Code Status: Full code  Family Communication: Discussed with patient's daughter at bedside   Consultants:    Procedures:      Objective   Vitals:   12/21/20 0522 12/21/20 0602 12/21/20 0814 12/21/20 1130  BP: 103/85     Pulse: (!) 120 (!) 102 92 (!) 110  Resp: 20 19 20 18   Temp: 98.1 F (36.7 C)     TempSrc: Oral     SpO2: 97% 96% 95% 90%  Weight:      Height:        Intake/Output Summary (Last 24 hours) at 12/21/2020 1310 Last data filed at 12/21/2020 0600 Gross per 24 hour  Intake 120 ml  Output 625 ml  Net -505 ml    03/18 1901 - 03/20 0700 In: 120 [P.O.:120] Out: 79  [Urine:725]  Filed Weights   12/18/20 0912  Weight: 62.1 kg    Physical Examination:  General-appears in no acute distress Heart-S1-S2, regular, no murmur auscultated HEENT- post laryngectomy stoma noted Lungs-clear to auscultation bilaterally, no wheezing or crackles auscultated Abdomen-soft, nontender, no organomegaly Extremities-no edema in the lower extremities Neuro-alert, oriented x3, no focal deficit noted  Status is: Inpatient  Dispo: The patient is from: Home              Anticipated d/c is to: To be decided              Anticipated d/c date is: 12/24/2020              Patient currently not stable for discharge  Barrier to discharge-ongoing treatment for acute hypoxemic respiratory failure  Microbiology  Recent Results (from the past 240 hour(s))  Resp Panel by RT-PCR (Flu A&B, Covid) Urine, Catheterized     Status: None   Collection Time: 12/18/20  4:35 AM   Specimen: Urine, Catheterized; Nasopharyngeal(NP) swabs in vial transport medium  Result Value Ref Range Status   SARS Coronavirus 2 by RT PCR  NEGATIVE Corrected    QUESTIONABLE IDENTIFICATION / INCORRECTLY LABELED SPECIMEN    Comment: CORRECTED ON 03/17 AT 0656: PREVIOUSLY REPORTED AS NEGATIVE   Influenza A by PCR  NEGATIVE Corrected    QUESTIONABLE IDENTIFICATION / INCORRECTLY LABELED SPECIMEN    Comment: CORRECTED ON 03/17 AT 3149: PREVIOUSLY REPORTED AS NEGATIVE   Influenza B by PCR  NEGATIVE Corrected    QUESTIONABLE IDENTIFICATION / INCORRECTLY LABELED SPECIMEN    Comment: Performed at [facility], [place] [address] [place], [place] [address] CORRECTED ON 03/17 AT 7858: PREVIOUSLY REPORTED AS NEGATIVE   Urine culture     Status: None   Collection Time: 12/18/20  6:41 AM   Specimen: In/Out Cath Urine  Result Value Ref Range Status   Specimen Description   Final    IN/OUT CATH URINE Performed at [facility] [address] [place], [place] [address]     Special Requests   Final    NONE Performed at  [facility], [place] [address], [place], [place] [address]    Culture   Final    NO GROWTH Performed at [facility], [place] [address] [place], [place] [address]    Report Status 12/20/2020 FINAL  Final  Blood Culture (routine x 2)     Status: None (Preliminary result)   Collection Time: 12/18/20  7:05 AM   Specimen: Right Antecubital; Blood  Result Value Ref Range Status   Specimen Description   Final    RIGHT ANTECUBITAL Performed at [facility] [address] [place], [place] [address]    Special Requests   Final    BOTTLES DRAWN AEROBIC AND ANAEROBIC Blood Culture adequate volume Performed at [facility] [address], [place], [place] [address]    Culture   Final    NO GROWTH 2 DAYS Performed at [facility] [address], [place], [place] [address]    Report Status PENDING  Incomplete  Blood Culture (routine x 2)     Status: None (Preliminary result)   Collection Time: 12/18/20  7:08 AM   Specimen: Left Antecubital; Blood  Result Value Ref Range Status   Specimen Description   Final    LEFT ANTECUBITAL Performed at [facility] [address], [place], [place] [address]    Special Requests   Final    BOTTLES DRAWN AEROBIC AND ANAEROBIC Blood Culture adequate volume Performed at [facility] [address] [place], [place] [address]    Culture   Final    NO GROWTH 2 DAYS Performed at [facility] [address] [place], [place] [address]    Report Status PENDING  Incomplete  Resp Panel by RT-PCR (Flu A&B, Covid) Nasopharyngeal Swab     Status: None   Collection Time: 12/18/20 11:27 AM   Specimen: Nasopharyngeal Swab; Nasopharyngeal(NP) swabs in vial transport medium  Result Value Ref Range Status   SARS Coronavirus 2 by RT PCR NEGATIVE NEGATIVE Final    Comment: (NOTE) SARS-CoV-2 target nucleic  acids are NOT DETECTED.        Influenza A by PCR NEGATIVE NEGATIVE Final   Influenza B by PCR NEGATIVE NEGATIVE Final    Comment: (NOTE)    C Difficile Quick Screen w PCR reflex     Status: None   Collection Time: 12/19/20 10:26 AM   Specimen: STOOL  Result Value Ref Range Status   C Diff antigen NEGATIVE NEGATIVE Final   C Diff toxin NEGATIVE NEGATIVE Final   C Diff interpretation No C. difficile detected.  Final    Comment: Performed at [facility], [place] [address] [place], [place] [address]    Pressure Injury 12/19/20 Sacrum Mid Stage 2 -  Partial thickness loss of dermis presenting as a shallow open injury with a red, pink wound bed without slough. (Active)  12/19/20 0000  Location: Sacrum  Location Orientation: Mid  Staging: Stage 2 -  Partial thickness loss of dermis presenting as a shallow open injury with a red, pink wound bed without slough.  Wound Description (Comments):   Present on Admission: Yes     [place]   [facility] If 7PM-7AM, please contact night-coverage at [email or web address], Office  [phone]   12/21/2020, 1:10 PM  LOS: 3 days

## 2020-12-22 ENCOUNTER — Inpatient Hospital Stay (HOSPITAL_COMMUNITY): Payer: Medicare Other

## 2020-12-22 DIAGNOSIS — J189 Pneumonia, unspecified organism: Secondary | ICD-10-CM

## 2020-12-22 DIAGNOSIS — J9601 Acute respiratory failure with hypoxia: Secondary | ICD-10-CM

## 2020-12-22 LAB — BRAIN NATRIURETIC PEPTIDE: B Natriuretic Peptide: 210.7 pg/mL — ABNORMAL HIGH (ref 0.0–100.0)

## 2020-12-22 LAB — LEGIONELLA PNEUMOPHILA SEROGP 1 UR AG: L. pneumophila Serogp 1 Ur Ag: NEGATIVE

## 2020-12-22 LAB — BLOOD GAS, ARTERIAL
Acid-Base Excess: 3 mmol/L — ABNORMAL HIGH (ref 0.0–2.0)
Bicarbonate: 26.9 mmol/L (ref 20.0–28.0)
O2 Saturation: 89.7 %
Patient temperature: 98.6
pCO2 arterial: 40.7 mmHg (ref 32.0–48.0)
pH, Arterial: 7.436 (ref 7.350–7.450)
pO2, Arterial: 56.5 mmHg — ABNORMAL LOW (ref 83.0–108.0)

## 2020-12-22 MED ORDER — TRAMADOL HCL 50 MG PO TABS
50.0000 mg | ORAL_TABLET | Freq: Once | ORAL | Status: AC
  Administered 2020-12-22: 50 mg via ORAL
  Filled 2020-12-22: qty 1

## 2020-12-22 MED ORDER — KETOROLAC TROMETHAMINE 30 MG/ML IJ SOLN
15.0000 mg | Freq: Once | INTRAMUSCULAR | Status: AC
  Administered 2020-12-22: 15 mg via INTRAVENOUS
  Filled 2020-12-22: qty 1

## 2020-12-22 NOTE — Progress Notes (Signed)
[facility]  PROGRESS NOTE  Ryan Miles IRW:[id number] DOB: Feb 23, 1946 DOA: 12/18/2020 PCP: [name], MD   Brief HPI:   75 year old male with medical history of esophageal carcinoma, lung carcinoma, hypertension, hypothyroidism presents with altered mental status.  As per daughter patient has been having intermittent episodes of confusion and increasing weakness for past few days.  Also has been talking nonsensical at times.  He was supposed to have a OT visit but was having generalized weakness that was concerning.  Therapist told that it was possible UTI which was causing all the symptoms.  Patient fell out of bed so EMS was called and he was brought to ED.  In the ED patient was found to be hypoxemic on room air, found to have bilateral pneumonia and AKI.  Started on IV antibiotics and IV fluids.    Subjective   Patient seen and examined, denies any complaints.  Repeat chest x-ray done today shows worsening of bibasilar opacities.  He continues to be febrile and requiring 8 L of blow-by oxygen via 4 pharyngectomy stoma.   Assessment/Plan:     1. Acute hypoxemic respiratory failure-secondary to bilateral lower lobe pneumonia, currently requiring 8 L/min oxygen via blow-by oxygen across post laryngectomy stoma.  Blood culture is negative to date.  Follow urine for Legionella, strep pneumo urinary antigen.  Continue Rocephin and Zithromax.  Consulted respiratory therapy to manage secretions.  WBC is down to 11.8.  2. Bilateral lower lobe pneumonia-patient presented with acute hypoxemic respiratory failure, started on Rocephin and Zithromax.  Repeat chest x-ray shows worsening of infiltrates.  Will consult pulmonology for further recommendations.,  Patient continues to require 8 L/min blow-by oxygen via stoma.  3. Altered mental status-resolved, likely from above.  Significantly improved.  CT head was negative.  4. Questionable dysphagia-concern for aspiration pneumonia,  speech therapy was consulted for swallow evaluation, patient has refused MBS and does not want PEG tube placement.  Appreciate palliative care consult, continue current management.     5. Acute kidney injury-creatinine is 1.65 on admission, has improved with IV fluids.  Today creatinine is 1.04.  Renal ultrasound shows no acute abnormality.  6. Hypertension-blood pressure is soft, home blood pressure medications including metoprolol, ramipril are on hold.  7. Hypothyroidism-continue Synthroid  8. Constipation-continue home regimen including Colace, MiraLAX.  9. History of esophageal cancer/s/p laryngectomy-patient has post laryngectomy stoma, will consult respiratory therapy for management of secretions.  10. Sinus pauses/wandering pacemaker/atrial bigeminy-patient's telemetry shows sinus pauses, atrial bigeminy, wandering pacemaker.  Cardiology was consulted for further recommendations.  At this time cardiology recommends outpatient event monitor to further assess.  No atrial fibrillation noted on telemetry.     COVID-19 Labs  No results for input(s): DDIMER, FERRITIN, LDH, CRP in the last 72 hours.  Lab Results  Component Value Date   [place] NEGATIVE 12/18/2020   [place]  12/18/2020    QUESTIONABLE IDENTIFICATION / INCORRECTLY LABELED SPECIMEN     Scheduled medications:   . aspirin EC  81 mg Oral Daily  . docusate sodium  100 mg Oral BID  . DULoxetine  20 mg Oral Daily  . enoxaparin (LOVENOX) injection  40 mg Subcutaneous Q24H  . feeding supplement  237 mL Oral BID BM  . gabapentin  300 mg Oral TID  . guaiFENesin  600 mg Oral BID  . levothyroxine  88 mcg Oral Daily  . methadone  5 mg Oral QID  . senna  1 tablet Oral BID  . cyanocobalamin  1,000  mcg Oral Daily       Data Reviewed:   CBG: No results for input(s): GLUCAP in the last 168 hours.  SpO2: 93 % O2 Flow Rate (L/min): 8 L/min FiO2 (%): 35 %      CBC:  Recent Labs  Lab 12/18/20 0449  12/18/20 0708 12/18/20 2256 12/19/20 0507 12/20/20 0550 12/21/20 0555  WBC QUESTIONABLE IDENTIFICATION / INCORRECTLY LABELED SPECIMEN 33.0* 25.7* 19.7* 14.9* 11.8*  HGB QUESTIONABLE IDENTIFICATION / INCORRECTLY LABELED SPECIMEN 11.5* 10.9* 9.5* 10.7* 10.7*  HCT QUESTIONABLE IDENTIFICATION / INCORRECTLY LABELED SPECIMEN 35.7* 34.1* 29.6* 33.9* 34.2*  PLT QUESTIONABLE IDENTIFICATION / INCORRECTLY LABELED SPECIMEN 236 227 200 215 240  MCV QUESTIONABLE IDENTIFICATION / INCORRECTLY LABELED SPECIMEN 87.1 88.3 88.9 89.7 89.8  MCH QUESTIONABLE IDENTIFICATION / INCORRECTLY LABELED SPECIMEN 28.0 28.2 28.5 28.3 28.1  MCHC QUESTIONABLE IDENTIFICATION / INCORRECTLY LABELED SPECIMEN 32.2 32.0 32.1 31.6 31.3  RDW QUESTIONABLE IDENTIFICATION / INCORRECTLY LABELED SPECIMEN 15.8* 15.9* 15.9* 15.9* 16.0*  LYMPHSABS QUESTIONABLE IDENTIFICATION / INCORRECTLY LABELED SPECIMEN 0.7  --   --   --   --   MONOABS QUESTIONABLE IDENTIFICATION / INCORRECTLY LABELED SPECIMEN 0.7  --   --   --   --   EOSABS QUESTIONABLE IDENTIFICATION / INCORRECTLY LABELED SPECIMEN 0.0  --   --   --   --   BASOSABS QUESTIONABLE IDENTIFICATION / INCORRECTLY LABELED SPECIMEN 0.0  --   --   --   --     Complete metabolic panel:  Recent Labs  Lab 12/18/20 0449 12/18/20 0625 12/18/20 0708 12/18/20 0905 12/18/20 2256 12/19/20 0507 12/20/20 0550 12/21/20 0555  NA QUESTIONABLE IDENTIFICATION / INCORRECTLY LABELED SPECIMEN  --  135  --   --  138 139 138  K QUESTIONABLE IDENTIFICATION / INCORRECTLY LABELED SPECIMEN  --  4.5  --   --  3.9 3.9 3.9  CL QUESTIONABLE IDENTIFICATION / INCORRECTLY LABELED SPECIMEN  --  99  --   --  106 105 104  CO2 QUESTIONABLE IDENTIFICATION / INCORRECTLY LABELED SPECIMEN  --  24  --   --  22 23 24   GLUCOSE QUESTIONABLE IDENTIFICATION / INCORRECTLY LABELED SPECIMEN  --  108*  --   --  102* 87 82  BUN QUESTIONABLE IDENTIFICATION / INCORRECTLY LABELED SPECIMEN  --  64*  --   --  48* 29* 18  CREATININE  QUESTIONABLE IDENTIFICATION / INCORRECTLY LABELED SPECIMEN  --  1.65*  --  1.10 1.04 0.87 0.58*  CALCIUM QUESTIONABLE IDENTIFICATION / INCORRECTLY LABELED SPECIMEN  --  8.4*  --   --  7.8* 8.1* 7.9*  AST QUESTIONABLE IDENTIFICATION / INCORRECTLY LABELED SPECIMEN  --  18  --   --  16 16 21   ALT QUESTIONABLE IDENTIFICATION / INCORRECTLY LABELED SPECIMEN  --  13  --   --  13 12 17   ALKPHOS QUESTIONABLE IDENTIFICATION / INCORRECTLY LABELED SPECIMEN  --  97  --   --  79 92 136*  BILITOT QUESTIONABLE IDENTIFICATION / INCORRECTLY LABELED SPECIMEN  --  1.6*  --   --  1.0 0.9 0.8  ALBUMIN QUESTIONABLE IDENTIFICATION / INCORRECTLY LABELED SPECIMEN  --  3.0*  --   --  2.2* 2.5* 2.3*  LATICACIDVEN  --  QUESTIONABLE IDENTIFICATION / INCORRECTLY LABELED SPECIMEN 1.9 1.7  --   --   --   --   INR  --   --  1.3*  --   --   --   --   --  No results for input(s): LIPASE, AMYLASE in the last 168 hours.  Recent Labs  Lab 12/18/20 0435 12/18/20 1127  [place] IDENTIFICATION / INCORRECTLY LABELED SPECIMEN NEGATIVE    ------------------------------------------------------------------------------------------------------------------  Coagulation profile  Recent Labs  Lab 12/18/20 0708  INR 1.3*       Radiology Reports  DG Chest Port 1 View  Result Date: 12/22/2020 CLINICAL DATA:  History of pneumonia.  Weakness. EXAM: PORTABLE CHEST 1 VIEW COMPARISON:  Chest x-ray 12/18/2020. FINDINGS: Bibasilar opacities may reflect areas of atelectasis and/or consolidation with superimposed small bilateral pleural effusions, overall slightly worse compared to the prior study. No pneumothorax. No evidence of pulmonary edema. Heart size is normal. Upper mediastinal contours are within normal limits. Aortic atherosclerosis. Surgical clips in the region of the thoracic inlet, likely from prior thyroidectomy. IMPRESSION: 1. Worsening bibasilar opacities which may reflect increasing areas of atelectasis  and/or consolidation, with superimposed small bilateral pleural effusions which also appear increased. Electronically Signed   By: Vinnie Langton M.D.   On: 12/22/2020 09:03      Antibiotics: Anti-infectives (From admission, onward)   Start     Dose/Rate Route Frequency Ordered Stop   12/18/20 0645  cefTRIAXone (ROCEPHIN) 2 g in sodium chloride 0.9 % 100 mL IVPB        2 g 200 mL/hr over 30 Minutes Intravenous Every 24 hours 12/18/20 0641     12/18/20 0645  azithromycin (ZITHROMAX) 500 mg in sodium chloride 0.9 % 250 mL IVPB        500 mg 250 mL/hr over 60 Minutes Intravenous Every 24 hours 12/18/20 0641         DVT prophylaxis: Lovenox  Code Status: Full code  Family Communication: Discussed with patient's daughter at bedside   Consultants:    Procedures:      Objective   Vitals:   12/22/20 0446 12/22/20 0806 12/22/20 0917 12/22/20 1103  BP: 115/72  118/60   Pulse: 90 86 89 86  Resp: 16 17 18 17   Temp: (!) 97.5 F (36.4 C)  (!) 97.5 F (36.4 C)   TempSrc: Oral  Oral   SpO2: 93% 94% 91% 93%  Weight:      Height:        Intake/Output Summary (Last 24 hours) at 12/22/2020 1309 Last data filed at 12/22/2020 1000 Gross per 24 hour  Intake 1078.46 ml  Output 650 ml  Net 428.46 ml    03/19 1901 - 03/21 0700 In: 581.4 [P.O.:180; I.V.:101.4] Out: 875 [Urine:875]  Filed Weights   12/18/20 0912  Weight: 62.1 kg    Physical Examination:  General-appears in no acute distress Heart-S1-S2, regular, no murmur auscultated HEENT-post laryngectomy stoma in the neck Lungs-clear to auscultation bilaterally, no wheezing or crackles auscultated Abdomen-soft, nontender, no organomegaly Extremities-no edema in the lower extremities Neuro-alert, oriented x3, no focal deficit noted  Status is: Inpatient  Dispo: The patient is from: Home              Anticipated d/c is to: To be decided              Anticipated d/c date is: 12/24/2020              Patient  currently not stable for discharge  Barrier to discharge-ongoing treatment for acute hypoxemic respiratory failure  Microbiology  Recent Results (from the past 240 hour(s))  Resp Panel by RT-PCR (Flu A&B, Covid) Urine, Catheterized     Status: None   Collection Time: 12/18/20  4:35 AM   Specimen: Urine, Catheterized; Nasopharyngeal(NP) swabs in vial transport medium  Result Value Ref Range Status   SARS Coronavirus 2 by RT PCR  NEGATIVE Corrected    QUESTIONABLE IDENTIFICATION / INCORRECTLY LABELED SPECIMEN    Comment: CORRECTED ON 03/17 AT 0656: PREVIOUSLY REPORTED AS NEGATIVE   Influenza A by PCR  NEGATIVE Corrected    QUESTIONABLE IDENTIFICATION / INCORRECTLY LABELED SPECIMEN    Comment: CORRECTED ON 03/17 AT 1610: PREVIOUSLY REPORTED AS NEGATIVE   Influenza B by PCR  NEGATIVE Corrected    QUESTIONABLE IDENTIFICATION / INCORRECTLY LABELED SPECIMEN    Comment: Performed at [facility], [place] [address] [place], [place] [address] CORRECTED ON 03/17 AT 4098: PREVIOUSLY REPORTED AS NEGATIVE   Urine culture     Status: None   Collection Time: 12/18/20  6:41 AM   Specimen: In/Out Cath Urine  Result Value Ref Range Status   Specimen Description   Final    IN/OUT CATH URINE Performed at [facility] [address] [place], [place] [address]    Special Requests   Final    NONE Performed at [facility], [place] [address], [place], [place] [address]    Culture   Final    NO GROWTH Performed at [facility], [place] [address], [place], [place] [address]    Report Status 12/20/2020 FINAL  Final  Blood Culture (routine x 2)     Status: None (Preliminary result)   Collection Time: 12/18/20  7:05 AM   Specimen: Right Antecubital; Blood  Result Value Ref Range Status   Specimen Description   Final    RIGHT ANTECUBITAL Performed at [facility] [address] [place], [place] [address]    Special Requests    Final    BOTTLES DRAWN AEROBIC AND ANAEROBIC Blood Culture adequate volume Performed at [facility] [address] [place], [place] [address]    Culture   Final    NO GROWTH 2 DAYS Performed at [facility] [address] [place], [place] [address]    Report Status PENDING  Incomplete  Blood Culture (routine x 2)     Status: None (Preliminary result)   Collection Time: 12/18/20  7:08 AM   Specimen: Left Antecubital; Blood  Result Value Ref Range Status   Specimen Description   Final    LEFT ANTECUBITAL Performed at [facility] [address], [place], [place] [address]    Special Requests   Final    BOTTLES DRAWN AEROBIC AND ANAEROBIC Blood Culture adequate volume Performed at [facility] [address], [place], [place] [address]    Culture   Final    NO GROWTH 2 DAYS Performed at [facility] [address] [place],  [address]    Report Status PENDING  Incomplete  Resp Panel by RT-PCR (Flu A&B, Covid) Nasopharyngeal Swab     Status: None   Collection Time: 12/18/20 11:27 AM   Specimen: Nasopharyngeal Swab; Nasopharyngeal(NP) swabs in vial transport medium  Result Value Ref Range Status   SARS Coronavirus 2 by RT PCR NEGATIVE NEGATIVE Final    Comment: (NOTE) SARS-CoV-2 target nucleic acids are NOT DETECTED.        Influenza A by PCR NEGATIVE NEGATIVE Final   Influenza B by PCR NEGATIVE NEGATIVE Final    Comment: (NOTE)    C Difficile Quick Screen w PCR reflex  Status: None   Collection Time: 12/19/20 10:26 AM   Specimen: STOOL  Result Value Ref Range Status   C Diff antigen NEGATIVE NEGATIVE Final   C Diff toxin NEGATIVE NEGATIVE Final   C Diff interpretation No C. difficile detected.  Final    Comment: Performed at [facility], [place] [address], [place], [place] [address]    Pressure Injury 12/19/20 Sacrum Mid Stage 2 -  Partial thickness loss of dermis  presenting as a shallow open injury with a red, pink wound bed without slough. (Active)  12/19/20 0000  Location: Sacrum  Location Orientation: Mid  Staging: Stage 2 -  Partial thickness loss of dermis presenting as a shallow open injury with a red, pink wound bed without slough.  Wound Description (Comments):   Present on Admission: Yes     [place]   [facility] If 7PM-7AM, please contact night-coverage at [email or web address], Office  [phone]   12/22/2020, 1:09 PM  LOS: 4 days

## 2020-12-22 NOTE — Consult Note (Signed)
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  He was diagnosed with bilateral pneumonia on admission and was treated with antibiotics.  Per he and his daughter he has continued to feel much better since admission.  His thinking is much clearer and he is not having any difficulty breathing.  He denies cough or chest congestion.  No chest pain.  No trouble swallowing. He has not had any leg swelling in the last few days.  He has never been on oxygen before so he is confused as to why he needs it now.  Today's CXR showed persistent hazy opacifications in the bases.   PCCM was consulted due to persistent hypoxemia.  See medical history below, it is extensive.  Most of his care has been provided at the [facility].  He really wants to go home.  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 3/17 admission . 3/21 PCCM consult persistent hypoxemia  Interim History / Subjective:  See above  Objective   Blood pressure 118/60, pulse 86, temperature (!) 97.5 F (36.4 C), temperature source Oral, resp. rate 17, height 5\' 7"  (1.702 m), weight 62.1 kg, SpO2 93 %.    FiO2 (%):  [35 %-40 %] 35 %   Intake/Output  Summary (Last 24 hours) at 12/22/2020 1401 Last data filed at 12/22/2020 1000 Gross per 24 hour  Intake 1078.46 ml  Output 650 ml  Net 428.46 ml   Filed Weights   12/18/20 0912  Weight: 62.1 kg    Examination:  General:  Chronically ill appearing, resting comfortably in bed HENT: NCAT OP clear, tracheal stoma noted PULM: Crackles R base B, normal effort CV: RRR, 3/6 systolic murmur apex GI: BS+, soft, nontender MSK: normal bulk and tone Neuro: awake, alert, no distress, MAEW   Labs/imaging personally reviewed   3/21 CXR images personally reviewed> clips right lung noted, bibasilar infiltrates? Pleural effusion  Resolved Hospital Problem list     Assessment & Plan:  Acute respiratory failure with hypoxemia > ddx broad> incorrect SpO2 readings? (I wonder about this because he is very comfortable and his collar is malpositioned when I am in the room with him.); if the hypoxemia is real then this could be heart failure or underlying lung disease as he is at risk for that. > check room air ABG to see if the hypoxemia is real > if hypoxemia is real, then check proBNP and HRCT > continue to wean off O2 to maintain O2 saturation > 88%  CAP> improved symptoms Would complete 7 days ceftriaxone and azithro (or oral equivalent)    Best practice (evaluated daily)   Per [facility]  Labs   CBC: Recent Labs  Lab 12/18/20 0449  12/18/20 0708 12/18/20 2256 12/19/20 0507 12/20/20 0550 12/21/20 0555  WBC QUESTIONABLE IDENTIFICATION / INCORRECTLY LABELED SPECIMEN 33.0* 25.7* 19.7* 14.9* 11.8*  NEUTROABS QUESTIONABLE IDENTIFICATION / INCORRECTLY LABELED SPECIMEN 30.7*  --   --   --   --   HGB QUESTIONABLE IDENTIFICATION / INCORRECTLY LABELED SPECIMEN 11.5* 10.9* 9.5* 10.7* 10.7*  HCT QUESTIONABLE IDENTIFICATION / INCORRECTLY LABELED SPECIMEN 35.7* 34.1* 29.6* 33.9* 34.2*  MCV QUESTIONABLE IDENTIFICATION / INCORRECTLY LABELED SPECIMEN 87.1 88.3 88.9 89.7 89.8  PLT QUESTIONABLE  IDENTIFICATION / INCORRECTLY LABELED SPECIMEN 236 227 200 215 585    Basic Metabolic Panel: Recent Labs  Lab 12/18/20 0449 12/18/20 0708 12/18/20 2256 12/19/20 0507 12/20/20 0550 12/21/20 0555  NA QUESTIONABLE IDENTIFICATION / INCORRECTLY LABELED SPECIMEN 135  --  138 139 138  K QUESTIONABLE IDENTIFICATION / INCORRECTLY LABELED SPECIMEN 4.5  --  3.9 3.9 3.9  CL QUESTIONABLE IDENTIFICATION / INCORRECTLY LABELED SPECIMEN 99  --  106 105 104  CO2 QUESTIONABLE IDENTIFICATION / INCORRECTLY LABELED SPECIMEN 24  --  22 23 24   GLUCOSE QUESTIONABLE IDENTIFICATION / INCORRECTLY LABELED SPECIMEN 108*  --  102* 87 82  BUN QUESTIONABLE IDENTIFICATION / INCORRECTLY LABELED SPECIMEN 64*  --  48* 29* 18  CREATININE QUESTIONABLE IDENTIFICATION / INCORRECTLY LABELED SPECIMEN 1.65* 1.10 1.04 0.87 0.58*  CALCIUM QUESTIONABLE IDENTIFICATION / INCORRECTLY LABELED SPECIMEN 8.4*  --  7.8* 8.1* 7.9*   GFR: Estimated Creatinine Clearance: 69 mL/min (A) (by C-G formula based on SCr of 0.58 mg/dL (L)). Recent Labs  Lab 12/18/20 0625 12/18/20 0708 12/18/20 0905 12/18/20 2256 12/19/20 0507 12/20/20 0550 12/21/20 0555  WBC  --  33.0*  --  25.7* 19.7* 14.9* 11.8*  LATICACIDVEN QUESTIONABLE IDENTIFICATION / INCORRECTLY LABELED SPECIMEN 1.9 1.7  --   --   --   --     Liver Function Tests: Recent Labs  Lab 12/18/20 0449 12/18/20 0708 12/19/20 0507 12/20/20 0550 12/21/20 0555  AST QUESTIONABLE IDENTIFICATION / INCORRECTLY LABELED SPECIMEN 18 16 16 21   ALT QUESTIONABLE IDENTIFICATION / INCORRECTLY LABELED SPECIMEN 13 13 12 17   ALKPHOS QUESTIONABLE IDENTIFICATION / INCORRECTLY LABELED SPECIMEN 97 79 92 136*  BILITOT QUESTIONABLE IDENTIFICATION / INCORRECTLY LABELED SPECIMEN 1.6* 1.0 0.9 0.8  PROT QUESTIONABLE IDENTIFICATION / INCORRECTLY LABELED SPECIMEN 7.6 5.9* 6.4* 6.0*  ALBUMIN QUESTIONABLE IDENTIFICATION / INCORRECTLY LABELED SPECIMEN 3.0* 2.2* 2.5* 2.3*   No results for input(s): LIPASE, AMYLASE  in the last 168 hours. No results for input(s): AMMONIA in the last 168 hours.  ABG No results found for: PHART, PCO2ART, PO2ART, HCO3, TCO2, ACIDBASEDEF, O2SAT   Coagulation Profile: Recent Labs  Lab 12/18/20 0708  INR 1.3*    Cardiac Enzymes: No results for input(s): CKTOTAL, CKMB, CKMBINDEX, TROPONINI in the last 168 hours.  HbA1C: No results found for: HGBA1C  CBG: No results for input(s): GLUCAP in the last 168 hours.  Review of Systems:   Gen: Denies fever, chills, weight change, fatigue, night sweats HEENT: Denies blurred vision, double vision, hearing loss, tinnitus, sinus congestion, rhinorrhea, sore throat, neck stiffness, dysphagia PULM: per HPI CV: Denies chest pain, edema, orthopnea, paroxysmal nocturnal dyspnea, palpitations GI: Denies abdominal pain, nausea, vomiting, diarrhea, hematochezia, melena, constipation, change in bowel habits GU: Denies dysuria, hematuria, polyuria, oliguria, urethral discharge Endocrine: Denies hot or cold intolerance, polyuria, polyphagia or appetite change Derm: Denies rash, dry skin, scaling or peeling skin change Heme: Denies easy bruising, bleeding, bleeding gums Neuro: Denies headache, numbness, weakness, slurred speech, loss of memory or consciousness   Past Medical History:  He,  has a past medical history of Coronary artery disease involving native coronary artery (03/2012), History of laryngeal cancer, Hypertension, Lung cancer ([place]), Spinal stenosis, and ST elevation myocardial infarction (STEMI) of inferior wall ([place]) (03/2012).   Surgical History:   Past Surgical History:  Procedure Laterality Date  . CORONARY STENT PLACEMENT  03/2012   [facility]: DES x2 to RCA.  [facility] LEFT HEART CATH AND CORONARY ANGIOGRAPHY  03/2012   ([facility])-inferior STEMI, 100% RCA.  [facility] [place] LUNG LOBECTOMY Right 02/2015   Right lower lobe ([place])  . TOTAL LARYNGECTOMY    . TRANSTHORACIC ECHOCARDIOGRAM  01/22/2020   EF estimated greater than 55%.  Mild  LV thickening.  Normal RV function.  Moderate MR.  Otherwise trivial AI, PRN TR.  Otherwise normal     Social History:   reports that he has quit smoking. He has never used smokeless tobacco. He reports that he does not drink alcohol and does not use drugs.   Family History:  His Family history is unknown by patient.   Allergies Allergies  Allergen Reactions  . Rosuvastatin     Other reaction(s): Headache, Other (See Comments) Entire body aches.  . Atorvastatin     Other reaction(s): Muscle Pain  . Cephalexin     Other reaction(s): Other (See Comments) "fuzzy mouth"  . Penicillins     Other reaction(s): Unknown     Home Medications  Prior to Admission medications   Medication Sig Start Date End Date Taking? Authorizing Provider  aspirin 81 MG EC tablet Take 81 mg by mouth daily. 06/09/12  Yes [provider]  cyanocobalamin 1000 MCG tablet Take 1,000 mcg by mouth daily. 06/20/20  Yes [provider]  DULoxetine (CYMBALTA) 20 MG capsule Take 20 mg by mouth daily. 11/10/20  Yes [provider]  feeding supplement (ENSURE ENLIVE / ENSURE PLUS) LIQD Take 237 mLs by mouth 2 (two) times daily between meals.   Yes [provider]  gabapentin (NEURONTIN) 300 MG capsule Take 300 mg by mouth 4 (four) times daily. 11/10/20  Yes [provider]  levothyroxine (SYNTHROID) 88 MCG tablet Take 88 mcg by mouth daily. 06/20/20  Yes [provider]  methadone (DOLOPHINE) 5 MG tablet Take 5 mg by mouth 4 (four) times daily. 11/28/20  Yes [provider]  metoprolol tartrate (LOPRESSOR) 25 MG tablet Take 25 mg by mouth 2 (two) times daily. 12/24/19  Yes [provider]  ramipril (ALTACE) 5 MG capsule Take 5 mg by mouth daily. 04/02/20  Yes [provider]  senna (SENOKOT) 8.6 MG tablet Take 3-4 tablets by mouth at bedtime.   Yes [provider]     Critical care time: n/a    Roselie Awkward, MD [place] PCCM Pager:  [phone] Cell: [phone] If no response, please call [phone] until 7pm After 7:00 pm call [place]  [phone]

## 2020-12-22 NOTE — Care Management Important Message (Signed)
Important Message  Patient Details IM Letter given to the Patient. Name: Ryan Miles MRN: [id number] Date of Birth: June 23, 1946   Medicare Important Message Given:  Yes     Kerin Salen 12/22/2020, 12:42 PM

## 2020-12-23 DIAGNOSIS — J9601 Acute respiratory failure with hypoxia: Secondary | ICD-10-CM | POA: Diagnosis not present

## 2020-12-23 DIAGNOSIS — J189 Pneumonia, unspecified organism: Secondary | ICD-10-CM | POA: Diagnosis not present

## 2020-12-23 LAB — CULTURE, BLOOD (ROUTINE X 2)
Culture: NO GROWTH
Culture: NO GROWTH
Special Requests: ADEQUATE
Special Requests: ADEQUATE

## 2020-12-23 MED ORDER — OXYCODONE HCL 5 MG PO TABS
5.0000 mg | ORAL_TABLET | Freq: Four times a day (QID) | ORAL | Status: DC | PRN
  Administered 2020-12-23 – 2020-12-25 (×4): 5 mg via ORAL
  Filled 2020-12-23 (×4): qty 1

## 2020-12-23 MED ORDER — PROSOURCE PLUS PO LIQD
30.0000 mL | Freq: Two times a day (BID) | ORAL | Status: DC
  Administered 2020-12-23 – 2020-12-25 (×4): 30 mL via ORAL
  Filled 2020-12-23 (×4): qty 30

## 2020-12-23 NOTE — Plan of Care (Signed)
  Problem: Clinical Measurements: Goal: Cardiovascular complication will be avoided Outcome: Progressing   Problem: Nutrition: Goal: Adequate nutrition will be maintained Outcome: Progressing   Problem: Coping: Goal: Level of anxiety will decrease Outcome: Progressing   Problem: Pain Managment: Goal: General experience of comfort will improve Outcome: Progressing

## 2020-12-23 NOTE — Progress Notes (Signed)
[facility]  PROGRESS NOTE  Lawyer Washabaugh GLO:[id number] DOB: 1946/02/12 DOA: 12/18/2020 PCP: [name], MD   Brief HPI:   75 year old male with medical history of esophageal carcinoma, lung carcinoma, hypertension, hypothyroidism presents with altered mental status.  As per daughter patient has been having intermittent episodes of confusion and increasing weakness for past few days.  Also has been talking nonsensical at times.  He was supposed to have a OT visit but was having generalized weakness that was concerning.  Therapist told that it was possible UTI which was causing all the symptoms.  Patient fell out of bed so EMS was called and he was brought to ED.  In the ED patient was found to be hypoxemic on room air, found to have bilateral pneumonia and AKI.  Started on IV antibiotics and IV fluids.    Subjective   Patient seen and examined, feels better this morning.  CT chest high-resolution was done by pulmonology yesterday which shows bibasilar infiltrates.  No other significant abnormality.  He continues to require 5 L/min of oxygen.   Assessment/Plan:     1. Acute hypoxemic respiratory failure-secondary to bilateral lower lobe pneumonia, currently requiring 5 L/min oxygen via blow-by oxygen across tracheostomy stoma.  Blood culture is negative to date.  Follow urine for Legionella, strep pneumo urinary antigen.  Continue Rocephin and Zithromax.  Consulted respiratory therapy to manage secretions.  WBC is down to 11.8.  Wean off oxygen as tolerated.  2. Bilateral lower lobe pneumonia-patient presented with acute hypoxemic respiratory failure, started on Rocephin and Zithromax.  Repeat chest x-ray shows worsening of infiltrates.  Pulmonology was consulted, CT chest high-resolution obtained which showed bibasilar infiltrates.  Pulmonology recommends to wean off oxygen as tolerated and then discharged home on oxygen when ready.  Also recommend to continue 7 days of treatment  with antibiotics.   3. Altered mental status-resolved, likely from above.  Significantly improved.  CT head was negative.  4. Questionable dysphagia-concern for aspiration pneumonia, speech therapy was consulted for swallow evaluation, patient has refused MBS and does not want PEG tube placement.  Appreciate palliative care consult, continue current management.     5. Acute kidney injury-creatinine was 1.65 on admission, has improved with IV fluids.  Today creatinine is 1.04.  Renal ultrasound shows no acute abnormality.  6. Hypertension-blood pressure is soft, home blood pressure medications including metoprolol, ramipril are on hold.  7. Hypothyroidism-continue Synthroid  8. Constipation-continue home regimen including Colace, MiraLAX.  9. History of esophageal cancer/s/p laryngectomy-patient has post laryngectomy stoma,  consulted respiratory therapy for management of secretions.  10. Sinus pauses/wandering pacemaker/atrial bigeminy-patient's telemetry shows sinus pauses, atrial bigeminy, wandering pacemaker.  Cardiology was consulted for further recommendations.  At this time cardiology recommends outpatient event monitor to further assess.  No atrial fibrillation noted on telemetry.     COVID-19 Labs  No results for input(s): DDIMER, FERRITIN, LDH, CRP in the last 72 hours.  Lab Results  Component Value Date   [place] NEGATIVE 12/18/2020   [place]  12/18/2020    QUESTIONABLE IDENTIFICATION / INCORRECTLY LABELED SPECIMEN     Scheduled medications:   . aspirin EC  81 mg Oral Daily  . docusate sodium  100 mg Oral BID  . DULoxetine  20 mg Oral Daily  . enoxaparin (LOVENOX) injection  40 mg Subcutaneous Q24H  . feeding supplement  237 mL Oral BID BM  . gabapentin  300 mg Oral TID  . guaiFENesin  600 mg Oral BID  .  levothyroxine  88 mcg Oral Daily  . methadone  5 mg Oral QID  . senna  1 tablet Oral BID  . cyanocobalamin  1,000 mcg Oral Daily       Data  Reviewed:   CBG: No results for input(s): GLUCAP in the last 168 hours.  SpO2: 95 % O2 Flow Rate (L/min): 5 L/min FiO2 (%): 28 %      CBC:  Recent Labs  Lab 12/18/20 0449 12/18/20 0708 12/18/20 2256 12/19/20 0507 12/20/20 0550 12/21/20 0555  WBC QUESTIONABLE IDENTIFICATION / INCORRECTLY LABELED SPECIMEN 33.0* 25.7* 19.7* 14.9* 11.8*  HGB QUESTIONABLE IDENTIFICATION / INCORRECTLY LABELED SPECIMEN 11.5* 10.9* 9.5* 10.7* 10.7*  HCT QUESTIONABLE IDENTIFICATION / INCORRECTLY LABELED SPECIMEN 35.7* 34.1* 29.6* 33.9* 34.2*  PLT QUESTIONABLE IDENTIFICATION / INCORRECTLY LABELED SPECIMEN 236 227 200 215 240  MCV QUESTIONABLE IDENTIFICATION / INCORRECTLY LABELED SPECIMEN 87.1 88.3 88.9 89.7 89.8  MCH QUESTIONABLE IDENTIFICATION / INCORRECTLY LABELED SPECIMEN 28.0 28.2 28.5 28.3 28.1  MCHC QUESTIONABLE IDENTIFICATION / INCORRECTLY LABELED SPECIMEN 32.2 32.0 32.1 31.6 31.3  RDW QUESTIONABLE IDENTIFICATION / INCORRECTLY LABELED SPECIMEN 15.8* 15.9* 15.9* 15.9* 16.0*  LYMPHSABS QUESTIONABLE IDENTIFICATION / INCORRECTLY LABELED SPECIMEN 0.7  --   --   --   --   MONOABS QUESTIONABLE IDENTIFICATION / INCORRECTLY LABELED SPECIMEN 0.7  --   --   --   --   EOSABS QUESTIONABLE IDENTIFICATION / INCORRECTLY LABELED SPECIMEN 0.0  --   --   --   --   BASOSABS QUESTIONABLE IDENTIFICATION / INCORRECTLY LABELED SPECIMEN 0.0  --   --   --   --     Complete metabolic panel:  Recent Labs  Lab 12/18/20 0449 12/18/20 0625 12/18/20 0708 12/18/20 0905 12/18/20 2256 12/19/20 0507 12/20/20 0550 12/21/20 0555 12/22/20 1551  NA QUESTIONABLE IDENTIFICATION / INCORRECTLY LABELED SPECIMEN  --  135  --   --  138 139 138  --   K QUESTIONABLE IDENTIFICATION / INCORRECTLY LABELED SPECIMEN  --  4.5  --   --  3.9 3.9 3.9  --   CL QUESTIONABLE IDENTIFICATION / INCORRECTLY LABELED SPECIMEN  --  99  --   --  106 105 104  --   CO2 QUESTIONABLE IDENTIFICATION / INCORRECTLY LABELED SPECIMEN  --  24  --   --  22 23 24    --   GLUCOSE QUESTIONABLE IDENTIFICATION / INCORRECTLY LABELED SPECIMEN  --  108*  --   --  102* 87 82  --   BUN QUESTIONABLE IDENTIFICATION / INCORRECTLY LABELED SPECIMEN  --  64*  --   --  48* 29* 18  --   CREATININE QUESTIONABLE IDENTIFICATION / INCORRECTLY LABELED SPECIMEN  --  1.65*  --  1.10 1.04 0.87 0.58*  --   CALCIUM QUESTIONABLE IDENTIFICATION / INCORRECTLY LABELED SPECIMEN  --  8.4*  --   --  7.8* 8.1* 7.9*  --   AST QUESTIONABLE IDENTIFICATION / INCORRECTLY LABELED SPECIMEN  --  18  --   --  16 16 21   --   ALT QUESTIONABLE IDENTIFICATION / INCORRECTLY LABELED SPECIMEN  --  13  --   --  13 12 17   --   ALKPHOS QUESTIONABLE IDENTIFICATION / INCORRECTLY LABELED SPECIMEN  --  97  --   --  79 92 136*  --   BILITOT QUESTIONABLE IDENTIFICATION / INCORRECTLY LABELED SPECIMEN  --  1.6*  --   --  1.0 0.9 0.8  --   ALBUMIN QUESTIONABLE IDENTIFICATION / INCORRECTLY LABELED  SPECIMEN  --  3.0*  --   --  2.2* 2.5* 2.3*  --   LATICACIDVEN  --  QUESTIONABLE IDENTIFICATION / INCORRECTLY LABELED SPECIMEN 1.9 1.7  --   --   --   --   --   INR  --   --  1.3*  --   --   --   --   --   --   BNP  --   --   --   --   --   --   --   --  210.7*    No results for input(s): LIPASE, AMYLASE in the last 168 hours.  Recent Labs  Lab 12/18/20 0435 12/18/20 1127 12/22/20 1551  BNP  --   --  210.7*  SARSCOV2NAA QUESTIONABLE IDENTIFICATION / INCORRECTLY LABELED SPECIMEN NEGATIVE  --     ------------------------------------------------------------------------------------------------------------------  Coagulation profile  Recent Labs  Lab 12/18/20 0708  INR 1.3*       Radiology Reports  CT Chest High Resolution  Result Date: 12/23/2020 CLINICAL DATA:  75 year old male with history of respiratory failure. History of esophageal carcinoma and lung carcinoma. EXAM: CT CHEST WITHOUT CONTRAST TECHNIQUE: Multidetector CT imaging of the chest was performed following the standard protocol without intravenous  contrast. High resolution imaging of the lungs, as well as inspiratory and expiratory imaging, was performed. COMPARISON:  No priors. FINDINGS: Cardiovascular: Heart size is normal. There is no significant pericardial fluid, thickening or pericardial calcification. There is aortic atherosclerosis, as well as atherosclerosis of the great vessels of the mediastinum and the coronary arteries, including calcified atherosclerotic plaque in the left main, left anterior descending, left circumflex and right coronary arteries. Mild calcifications of the aortic valve. Moderate calcifications of the mitral annulus. Mediastinum/Nodes: Status post laryngectomy. Multiple prominent borderline enlarged and mildly enlarged mediastinal and hilar lymph nodes are noted, measuring up to 1.5 cm in short axis in the low right paratracheal nodal station. Esophagus is unremarkable in appearance. No axillary lymphadenopathy. Lungs/Pleura: Dependent areas of airspace consolidation are noted in the lungs bilaterally, most evident in the lower lobes, but also involving the dependent portions of the left upper lobe. Additional patchy areas of mild peribronchovascular ground-glass attenuation are noted, most evident in the right upper lobe. Scattered areas of thickening of the peribronchovascular interstitium. Small bilateral pleural effusions. Inspiratory and expiratory imaging is unremarkable. Retained secretions are noted in the trachea and right mainstem bronchus. Upper Abdomen: Aortic atherosclerosis.  Status post cholecystectomy. Musculoskeletal: There are no aggressive appearing lytic or blastic lesions noted in the visualized portions of the skeleton. IMPRESSION: 1. Dependent areas of airspace consolidation throughout the lungs bilaterally, concerning for potential aspiration pneumonia. Small bilateral parapneumonic pleural effusions. 2. Multiple prominent borderline enlarged and mildly enlarged mediastinal bilateral hilar lymph nodes,  nonspecific in the setting of pneumonia. Attention on follow-up studies is recommended to ensure regression of these findings, particularly in light of the patient's history of malignancy. 3. Aortic atherosclerosis, in addition to left main and 3 vessel coronary artery disease. Assessment for potential risk factor modification, dietary therapy or pharmacologic therapy may be warranted, if clinically indicated. 4. There are calcifications of the aortic valve and mitral annulus. Echocardiographic correlation for evaluation of potential valvular dysfunction may be warranted if clinically indicated. Aortic Atherosclerosis (ICD10-I70.0). Electronically Signed   By: Vinnie Langton M.D.   On: 12/23/2020 07:17   DG Chest Port 1 View  Result Date: 12/22/2020 CLINICAL DATA:  History of pneumonia.  Weakness. EXAM: PORTABLE CHEST  1 VIEW COMPARISON:  Chest x-ray 12/18/2020. FINDINGS: Bibasilar opacities may reflect areas of atelectasis and/or consolidation with superimposed small bilateral pleural effusions, overall slightly worse compared to the prior study. No pneumothorax. No evidence of pulmonary edema. Heart size is normal. Upper mediastinal contours are within normal limits. Aortic atherosclerosis. Surgical clips in the region of the thoracic inlet, likely from prior thyroidectomy. IMPRESSION: 1. Worsening bibasilar opacities which may reflect increasing areas of atelectasis and/or consolidation, with superimposed small bilateral pleural effusions which also appear increased. Electronically Signed   By: Vinnie Langton M.D.   On: 12/22/2020 09:03      Antibiotics: Anti-infectives (From admission, onward)   Start     Dose/Rate Route Frequency Ordered Stop   12/18/20 0645  cefTRIAXone (ROCEPHIN) 2 g in sodium chloride 0.9 % 100 mL IVPB        2 g 200 mL/hr over 30 Minutes Intravenous Every 24 hours 12/18/20 0641 12/25/20 0659   12/18/20 0645  azithromycin (ZITHROMAX) 500 mg in sodium chloride 0.9 % 250 mL  IVPB        500 mg 250 mL/hr over 60 Minutes Intravenous Every 24 hours 12/18/20 0641 12/25/20 0859       DVT prophylaxis: Lovenox  Code Status: Full code  Family Communication: Discussed with patient's daughter at bedside   Consultants:    Procedures:      Objective   Vitals:   12/23/20 0555 12/23/20 0811 12/23/20 0900 12/23/20 1117  BP: 123/71 132/79 118/80   Pulse: 93 97 94 91  Resp: 18 16  15   Temp: 97.8 F (36.6 C)  97.9 F (36.6 C)   TempSrc: Oral  Oral   SpO2: 96% 95%  95%  Weight:      Height:        Intake/Output Summary (Last 24 hours) at 12/23/2020 1213 Last data filed at 12/23/2020 0300 Gross per 24 hour  Intake 496.73 ml  Output 1 ml  Net 495.73 ml    03/20 1901 - 03/22 0700 In: 1575.2 [P.O.:660; I.V.:206.1] Out: 501 [Urine:500]  Filed Weights   12/18/20 0912  Weight: 62.1 kg    Physical Examination:  General-appears in no acute distress Heart-S1-S2, regular, no murmur auscultated HEENT-tracheostomy stoma noted Lungs-clear to auscultation bilaterally, no wheezing or crackles auscultated Abdomen-soft, nontender, no organomegaly Extremities-no edema in the lower extremities Neuro-alert, oriented x3, no focal deficit noted  Status is: Inpatient  Dispo: The patient is from: Home              Anticipated d/c is to: To be decided              Anticipated d/c date is: 12/24/2020              Patient currently not stable for discharge  Barrier to discharge-ongoing treatment for acute hypoxemic respiratory failure, weaning off oxygen as tolerated  Microbiology  Recent Results (from the past 240 hour(s))  Resp Panel by RT-PCR (Flu A&B, Covid) Urine, Catheterized     Status: None   Collection Time: 12/18/20  4:35 AM   Specimen: Urine, Catheterized; Nasopharyngeal(NP) swabs in vial transport medium  Result Value Ref Range Status   SARS Coronavirus 2 by RT PCR  NEGATIVE Corrected    QUESTIONABLE IDENTIFICATION / INCORRECTLY LABELED  SPECIMEN    Comment: CORRECTED ON 03/17 AT 0656: PREVIOUSLY REPORTED AS NEGATIVE   Influenza A by PCR  NEGATIVE Corrected    QUESTIONABLE IDENTIFICATION / INCORRECTLY LABELED SPECIMEN    Comment:  CORRECTED ON 03/17 AT 0656: PREVIOUSLY REPORTED AS NEGATIVE   Influenza B by PCR  NEGATIVE Corrected    QUESTIONABLE IDENTIFICATION / INCORRECTLY LABELED SPECIMEN    Comment: Performed at [facility], [place] [address] [place], [place] [address] CORRECTED ON 03/17 AT 1308: PREVIOUSLY REPORTED AS NEGATIVE   Urine culture     Status: None   Collection Time: 12/18/20  6:41 AM   Specimen: In/Out Cath Urine  Result Value Ref Range Status   Specimen Description   Final    IN/OUT CATH URINE Performed at [facility] [address], [place], [place] [address]    Special Requests   Final    NONE Performed at [facility], [place] [address], [place], [place] [address]    Culture   Final    NO GROWTH Performed at [facility], [place] [address] [place], [place] [address]    Report Status 12/20/2020 FINAL  Final  Blood Culture (routine x 2)     Status: None (Preliminary result)   Collection Time: 12/18/20  7:05 AM   Specimen: Right Antecubital; Blood  Result Value Ref Range Status   Specimen Description   Final    RIGHT ANTECUBITAL Performed at [place] [address], [place], [place] [address]    Special Requests   Final    BOTTLES DRAWN AEROBIC AND ANAEROBIC Blood Culture adequate volume Performed at [facility] [address] [place], [place] [address]    Culture   Final    NO GROWTH 2 DAYS Performed at [facility] [address], [place], [place] [address]    Report Status PENDING  Incomplete  Blood Culture (routine x 2)     Status: None (Preliminary result)   Collection Time: 12/18/20  7:08 AM   Specimen: Left Antecubital; Blood  Result Value Ref Range Status   Specimen  Description   Final    LEFT ANTECUBITAL Performed at [facility] [address], [place], [place] [address]    Special Requests   Final    BOTTLES DRAWN AEROBIC AND ANAEROBIC Blood Culture adequate volume Performed at [facility] [address], [place], [place] [address]    Culture   Final    NO GROWTH 2 DAYS Performed at [facility] [address] [place], [place] [address]    Report Status PENDING  Incomplete  Resp Panel by RT-PCR (Flu A&B, Covid) Nasopharyngeal Swab     Status: None   Collection Time: 12/18/20 11:27 AM   Specimen: Nasopharyngeal Swab; Nasopharyngeal(NP) swabs in vial transport medium  Result Value Ref Range Status   SARS Coronavirus 2 by RT PCR NEGATIVE NEGATIVE Final    Comment: (NOTE) SARS-CoV-2 target nucleic acids are NOT DETECTED.        Influenza A by PCR NEGATIVE NEGATIVE Final   Influenza B by PCR NEGATIVE NEGATIVE Final    Comment: (NOTE)    C Difficile Quick Screen w PCR reflex     Status: None   Collection Time: 12/19/20 10:26 AM   Specimen: STOOL  Result Value Ref Range Status   C Diff antigen NEGATIVE NEGATIVE Final   C Diff toxin NEGATIVE NEGATIVE Final   C Diff interpretation No C. difficile detected.  Final    Comment: Performed at [facility], [place] [address] [place],  [address]    Pressure Injury 12/19/20 Sacrum Mid Stage  2 -  Partial thickness loss of dermis presenting as a shallow open injury with a red, pink wound bed without slough. (Active)  12/19/20 0000  Location: Sacrum  Location Orientation: Mid  Staging: Stage 2 -  Partial thickness loss of dermis presenting as a shallow open injury with a red, pink wound bed without slough.  Wound Description (Comments):   Present on Admission: Yes     [place]   [facility] If 7PM-7AM, please contact night-coverage at [email or web address], Office  [phone]   12/23/2020, 12:13 PM  LOS: 5 days

## 2020-12-23 NOTE — Progress Notes (Addendum)
Initial Nutrition Assessment  DOCUMENTATION CODES:   Not applicable  INTERVENTION:  - continue Ensure Enlive BID, each supplement provides 350 kcal and 20 grams of protein. - will order 30 ml Prosource Plus BID, each supplement provides 100 kcal and 15 grams protein.  - complete NFPE when feasible.   NUTRITION DIAGNOSIS:   Inadequate oral intake related to decreased appetite,lethargy/confusion as evidenced by meal completion < 50%.  GOAL:   Patient will meet greater than or equal to 90% of their needs  MONITOR:   PO intake,Supplement acceptance,Labs,Weight trends  REASON FOR ASSESSMENT:   Consult Assessment of nutrition requirement/status  ASSESSMENT:   75 year old male with medical history of esophageal carcinoma, lung carcinoma, HTN, and hypothyroidism. He presented to the ED due to AMS. His daughter reported that he has been experiencing intermittent episodes of confusion and increased weakness for the few days PTA. In the ED he was found to be hypoxemic, to have bilateral PNA, and dx with AKI. He was started on IV abx and IV fluids.  Diet advanced from NPO to Heart Healthy on 3/17 at 2205 and the only documented meal intakes since that time were 0% of breakfast on 3/18 and 40% of breakfast on 3/21.   Ensure Enlive was ordered BID on 3/18 and he has accepted this supplement 100% of the time offered.   Weight on 3/17 was 137 lb and PTA the only other weight documented in the chart was when he weighed 131 lb at [facility] on 05/14/20; indicates 6 lb weight gain in 7 months. Non-pitting edema to BLE.  Per notes: - acute hypoxemic respiratory failure 2/2 bilateral PNA - AMS present on admission has resolved - possible dysphagia with concern for aspiration--has declined MBS and does not want a feeding tube - AKI - chronic constipation - from home with unclear plan concerning d/c   Labs reviewed; creatinine: 0.58 mg/dl, Ca: 7.9 mg/dl, Alk Phos elevated and trending up from  3/21. Medications reviewed; 100 mg colace BID, 88 mcg oral synthroid/day, 1 tablet senokot BID, 1000 mcg oral cyanocobalamin/day.     NUTRITION - FOCUSED PHYSICAL EXAM:  unable to complete at this time.   Diet Order:   Diet Order            Diet regular Room service appropriate? Yes; Fluid consistency: Thin  Diet effective now                 EDUCATION NEEDS:   No education needs have been identified at this time  Skin:  Skin Assessment: Skin Integrity Issues: Skin Integrity Issues:: Stage II Stage II: sacrum  Last BM:  3/21  Height:   Ht Readings from Last 1 Encounters:  12/18/20 5' 7"  (1.702 m)    Weight:   Wt Readings from Last 1 Encounters:  12/18/20 62.1 kg    Estimated Nutritional Needs:  Kcal:  1600-1800 kcal Protein:  80-90 grams Fluid:  >/= 1.8 L/day       Jarome Matin, MS, RD, LDN, CNSC Inpatient Clinical Dietitian RD pager # available in AMION  After hours/weekend pager # available in [facility]

## 2020-12-23 NOTE — Progress Notes (Signed)
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  He was diagnosed with bilateral pneumonia on admission and was treated with antibiotics.Marland Kitchen   PCCM was consulted due to persistent hypoxemia.  See medical history below, it is extensive.  Most of his care has been provided at the [facility].  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 CT chest obtained w/ dense bibasilar consolidative airspace disease w/ very small effusions. Still has some enlarged LNs which are non-specific.   Interim History / Subjective:  Feels better. Wants to go home   Objective   Blood pressure 118/80, pulse 94, temperature 97.9 F (36.6 C), temperature source Oral, resp. rate 18, height 5\' 7"  (1.702 m), weight 62.1 kg, SpO2 96 %.    FiO2 (%):  [28 %-35 %] 28 %   Intake/Output Summary (Last 24 hours) at 12/23/2020 1002 Last data filed at 12/23/2020 0300 Gross per 24 hour  Intake 496.73 ml  Output 1 ml  Net 495.73 ml   Filed Weights   12/18/20 0912  Weight: 62.1 kg    Examination:  General this is a 75 year old male. He is currently lying in the supine position.  Not in distress HENT laryngectomy stoma is dry and intact.  Pulm some scattered rhonchi. No accessory use. Now on 28% ATC Card rrr abd soft not tender Ext warm and dry  Neuro intact  Labs/imaging personally reviewed   3/21 CXR images personally reviewed> clips right lung noted, bibasilar infiltrates? Pleural effusion  Resolved Hospital Problem list     Assessment & Plan:  Acute respiratory failure with hypoxemia in setting of bibasilar densely consolidative PNA (NOS) superimposed on underlying COPD.  -looking at his CT scan it seems as though his on-going hypoxia is well explained by the consolidative bibasilar PNA. There are small effusions but doubt this is resulting in much symptom burden and they are way to small to tap. On a good note he is now on 28% ATC Plan/rec Cont pulse ox Would titrate oxygen for sats > 88% (this should include during PT activities) Complete total 7 days abx  Some of this is just going to take time to clear. If able to send home w/ oxygen reasonable to dc as soon as primary team feels OK to do so.   Best practice (evaluated daily)   Per [place] ACNP-BC [place] Pager # [phone] OR # [phone] if no answer

## 2020-12-24 ENCOUNTER — Encounter (HOSPITAL_COMMUNITY): Payer: Self-pay | Admitting: Internal Medicine

## 2020-12-24 ENCOUNTER — Other Ambulatory Visit: Payer: Self-pay

## 2020-12-24 MED ORDER — METOPROLOL TARTRATE 25 MG PO TABS: 25.0000 mg | ORAL_TABLET | Freq: Two times a day (BID) | ORAL | Status: DC

## 2020-12-24 NOTE — Progress Notes (Signed)
PROGRESS NOTE    Ryan Miles  WYO:[id number] DOB: 1946/06/07 DOA: 12/18/2020 PCP: [name], MD    Brief Narrative:  75 year old male with medical history of esophageal carcinoma, lung carcinoma, hypertension, hypothyroidism presents with altered mental status.  As per daughter patient has been having intermittent episodes of confusion and increasing weakness for past few days.  Also has been talking nonsensical at times.  He was supposed to have a OT visit but was having generalized weakness that was concerning.  Therapist told that it was possible UTI which was causing all the symptoms.  Patient fell out of bed so EMS was called and he was brought to ED.  In the ED patient was found to be hypoxemic on room air, found to have bilateral pneumonia and AKI.  Started on IV antibiotics and IV fluids.  Assessment & Plan:   Principal Problem:   Bilateral upper lobe community acquired pneumonia Active Problems:   Pressure injury of skin   Coronary artery disease involving native heart without angina pectoris   Atrial premature contractions   Sinus pause   Wandering pacemaker   Acute hypoxemic respiratory failure ([place])   1. Acute hypoxemic respiratory failure-secondary to bilateral lower lobe pneumonia, currently requiring 5 L/min oxygen via blow-by oxygen across tracheostomy stoma.  Blood culture is negative to date.  Follow urine for Legionella, strep pneumo urinary antigen.  Continue Rocephin and Zithromax.  Consulted respiratory therapy to manage secretions.  WBC had been trending down.  Appreciate input by Pulmonary - recommendation for arranging home O2 with close f/u with [facility]. Family to call ENT clinic for /fu. Pulmonary had signed off  2. Bilateral lower lobe pneumonia-patient presented with acute hypoxemic respiratory failure, started on Rocephin and Zithromax.  Repeat chest x-ray shows worsening of infiltrates.  Pulmonology was consulted, CT chest high-resolution  obtained which showed bibasilar infiltrates.  Pulmonary recommend to continue 7 days of treatment with antibiotics. Per pulmonary, recommendation to arrange home O2 [facility]  3. Altered mental status-resolved, likely from above.  Significantly improved and since resolved.  CT head was negative.  4. Questionable dysphagia-concern for aspiration pneumonia, speech therapy was consulted for swallow evaluation, patient has refused MBS and does not want PEG tube placement.  Appreciate palliative care consult, continue current management.     5. Acute kidney injury-creatinine was 1.65 on admission, has improved with IV fluids.  Today creatinine is 1.04.  Renal ultrasound without acute abnormality.  6. Hypertension-blood pressure is soft, home blood pressure medications including metoprolol, ramipril continues to be on hold.  7. Hypothyroidism-continue Synthroid  8. Constipation-continue home regimen including Colace, MiraLAX.  9. History of esophageal cancer/s/p laryngectomy-patient has post laryngectomy stoma,  consulted respiratory therapy for management of secretions.  10. Sinus pauses/wandering pacemaker/atrial bigeminy-patient's telemetry shows sinus pauses, atrial bigeminy, wandering pacemaker.  Cardiology was consulted for further recommendations. Cardiology had recommended outpatient event monitor to further assess.  No atrial fibrillation noted on telemetry. Cardiology has recommended not resuming BB given pauses 11. Tachycardia - HR now consistently in the 110's. Holding beta blocker secondary to pauses. Would try carotid massage   DVT prophylaxis: Lovenox subq Code Status: Full Family Communication: Pt in room, family not at bedside  Status is: Inpatient  Remains inpatient appropriate because:Unsafe d/c plan and Inpatient level of care appropriate due to severity of illness   Dispo: The patient is from: Home              Anticipated  d/c is to: Home               Patient currently is not medically stable to d/c.   Difficult to place patient No       Consultants:   Pulmonary  Cardiology  Procedures:     Antimicrobials: Anti-infectives (From admission, onward)   Start     Dose/Rate Route Frequency Ordered Stop   12/18/20 0645  cefTRIAXone (ROCEPHIN) 2 g in sodium chloride 0.9 % 100 mL IVPB        2 g 200 mL/hr over 30 Minutes Intravenous Every 24 hours 12/18/20 0641 12/24/20 0729   12/18/20 0645  azithromycin (ZITHROMAX) 500 mg in sodium chloride 0.9 % 250 mL IVPB        500 mg 250 mL/hr over 60 Minutes Intravenous Every 24 hours 12/18/20 0641 12/25/20 0859       Subjective: Without complaints  Objective: Vitals:   12/24/20 0035 12/24/20 0300 12/24/20 0610 12/24/20 0842  BP:   137/83   Pulse: 91 (!) 106 65 (!) 118  Resp: 16 20 20 18   Temp:   98.4 F (36.9 C)   TempSrc:   Oral   SpO2: 95% 96% 91% 96%  Weight:      Height:        Intake/Output Summary (Last 24 hours) at 12/24/2020 0928 Last data filed at 12/24/2020 3532 Gross per 24 hour  Intake 1077.46 ml  Output 1200 ml  Net -122.54 ml   Filed Weights   12/18/20 0912  Weight: 62.1 kg    Examination:  General exam: Appears calm and comfortable  Respiratory system: Clear to auscultation. Respiratory effort normal. Cardiovascular system: S1 & S2 heard, tachycardia Gastrointestinal system: Abdomen is nondistended, soft and nontender. No organomegaly or masses felt. Normal bowel sounds heard. Central nervous system: Alert and oriented. No focal neurological deficits. Extremities: Symmetric 5 x 5 power. Skin: No rashes, lesions Psychiatry: Judgement and insight appear normal. Mood & affect appropriate.   Data Reviewed: I have personally reviewed following labs and imaging studies  CBC: Recent Labs  Lab 12/18/20 0449 12/18/20 0708 12/18/20 2256 12/19/20 0507 12/20/20 0550 12/21/20 0555  WBC QUESTIONABLE IDENTIFICATION / INCORRECTLY LABELED SPECIMEN  33.0* 25.7* 19.7* 14.9* 11.8*  NEUTROABS QUESTIONABLE IDENTIFICATION / INCORRECTLY LABELED SPECIMEN 30.7*  --   --   --   --   HGB QUESTIONABLE IDENTIFICATION / INCORRECTLY LABELED SPECIMEN 11.5* 10.9* 9.5* 10.7* 10.7*  HCT QUESTIONABLE IDENTIFICATION / INCORRECTLY LABELED SPECIMEN 35.7* 34.1* 29.6* 33.9* 34.2*  MCV QUESTIONABLE IDENTIFICATION / INCORRECTLY LABELED SPECIMEN 87.1 88.3 88.9 89.7 89.8  PLT QUESTIONABLE IDENTIFICATION / INCORRECTLY LABELED SPECIMEN 236 227 200 215 992   Basic Metabolic Panel: Recent Labs  Lab 12/18/20 0449 12/18/20 0708 12/18/20 2256 12/19/20 0507 12/20/20 0550 12/21/20 0555  NA QUESTIONABLE IDENTIFICATION / INCORRECTLY LABELED SPECIMEN 135  --  138 139 138  K QUESTIONABLE IDENTIFICATION / INCORRECTLY LABELED SPECIMEN 4.5  --  3.9 3.9 3.9  CL QUESTIONABLE IDENTIFICATION / INCORRECTLY LABELED SPECIMEN 99  --  106 105 104  CO2 QUESTIONABLE IDENTIFICATION / INCORRECTLY LABELED SPECIMEN 24  --  22 23 24   GLUCOSE QUESTIONABLE IDENTIFICATION / INCORRECTLY LABELED SPECIMEN 108*  --  102* 87 82  BUN QUESTIONABLE IDENTIFICATION / INCORRECTLY LABELED SPECIMEN 64*  --  48* 29* 18  CREATININE QUESTIONABLE IDENTIFICATION / INCORRECTLY LABELED SPECIMEN 1.65* 1.10 1.04 0.87 0.58*  CALCIUM QUESTIONABLE IDENTIFICATION / INCORRECTLY LABELED SPECIMEN 8.4*  --  7.8* 8.1* 7.9*   GFR:  Estimated Creatinine Clearance: 69 mL/min (A) (by C-G formula based on SCr of 0.58 mg/dL (L)). Liver Function Tests: Recent Labs  Lab 12/18/20 0449 12/18/20 0708 12/19/20 0507 12/20/20 0550 12/21/20 0555  AST QUESTIONABLE IDENTIFICATION / INCORRECTLY LABELED SPECIMEN 18 16 16 21   ALT QUESTIONABLE IDENTIFICATION / INCORRECTLY LABELED SPECIMEN 13 13 12 17   ALKPHOS QUESTIONABLE IDENTIFICATION / INCORRECTLY LABELED SPECIMEN 97 79 92 136*  BILITOT QUESTIONABLE IDENTIFICATION / INCORRECTLY LABELED SPECIMEN 1.6* 1.0 0.9 0.8  PROT QUESTIONABLE IDENTIFICATION / INCORRECTLY LABELED SPECIMEN 7.6 5.9*  6.4* 6.0*  ALBUMIN QUESTIONABLE IDENTIFICATION / INCORRECTLY LABELED SPECIMEN 3.0* 2.2* 2.5* 2.3*   No results for input(s): LIPASE, AMYLASE in the last 168 hours. No results for input(s): AMMONIA in the last 168 hours. Coagulation Profile: Recent Labs  Lab 12/18/20 0708  INR 1.3*   Cardiac Enzymes: No results for input(s): CKTOTAL, CKMB, CKMBINDEX, TROPONINI in the last 168 hours. BNP (last 3 results) No results for input(s): PROBNP in the last 8760 hours. HbA1C: No results for input(s): HGBA1C in the last 72 hours. CBG: No results for input(s): GLUCAP in the last 168 hours. Lipid Profile: No results for input(s): CHOL, HDL, LDLCALC, TRIG, CHOLHDL, LDLDIRECT in the last 72 hours. Thyroid Function Tests: No results for input(s): TSH, T4TOTAL, FREET4, T3FREE, THYROIDAB in the last 72 hours. Anemia Panel: No results for input(s): VITAMINB12, FOLATE, FERRITIN, TIBC, IRON, RETICCTPCT in the last 72 hours. Sepsis Labs: Recent Labs  Lab 12/18/20 0625 12/18/20 0708 12/18/20 0905  LATICACIDVEN QUESTIONABLE IDENTIFICATION / INCORRECTLY LABELED SPECIMEN 1.9 1.7    Recent Results (from the past 240 hour(s))  Resp Panel by RT-PCR (Flu A&B, Covid) Urine, Catheterized     Status: None   Collection Time: 12/18/20  4:35 AM   Specimen: Urine, Catheterized; Nasopharyngeal(NP) swabs in vial transport medium  Result Value Ref Range Status   SARS Coronavirus 2 by RT PCR  NEGATIVE Corrected    QUESTIONABLE IDENTIFICATION / INCORRECTLY LABELED SPECIMEN    Comment: CORRECTED ON 03/17 AT 0656: PREVIOUSLY REPORTED AS NEGATIVE   Influenza A by PCR  NEGATIVE Corrected    QUESTIONABLE IDENTIFICATION / INCORRECTLY LABELED SPECIMEN    Comment: CORRECTED ON 03/17 AT 4709: PREVIOUSLY REPORTED AS NEGATIVE   Influenza B by PCR  NEGATIVE Corrected    QUESTIONABLE IDENTIFICATION / INCORRECTLY LABELED SPECIMEN    Comment: Performed at [facility], [place] [address] [place], [place]  [address] CORRECTED ON 03/17 AT 6294: PREVIOUSLY REPORTED AS NEGATIVE   Urine culture     Status: None   Collection Time: 12/18/20  6:41 AM   Specimen: In/Out Cath Urine  Result Value Ref Range Status   Specimen Description   Final    IN/OUT CATH URINE Performed at [place] [address] [place], [place] [address]    Special Requests   Final    NONE Performed at [facility], [place] [address] [place], [place] [address]    Culture   Final    NO GROWTH Performed at [facility], [place] [address] [place], [place] [address]    Report Status 12/20/2020 FINAL  Final  Blood Culture (routine x 2)     Status: None   Collection Time: 12/18/20  7:05 AM   Specimen: Right Antecubital; Blood  Result Value Ref Range Status   Specimen Description   Final    RIGHT ANTECUBITAL Performed at [place] [address] [place], [place] [address]  Special Requests   Final    BOTTLES DRAWN AEROBIC AND ANAEROBIC Blood Culture adequate volume Performed at [place] [address] [place], [place] [address]    Culture   Final    NO GROWTH 5 DAYS Performed at [facility], [place] [address] [place], [place] [address]    Report Status 12/23/2020 FINAL  Final  Blood Culture (routine x 2)     Status: None   Collection Time: 12/18/20  7:08 AM   Specimen: Left Antecubital; Blood  Result Value Ref Range Status   Specimen Description   Final    LEFT ANTECUBITAL Performed at [place] [address], [place], [place] [address]    Special Requests   Final    BOTTLES DRAWN AEROBIC AND ANAEROBIC Blood Culture adequate volume Performed at [place] [address], [place], [place] [address]    Culture   Final    NO GROWTH 5 DAYS Performed at [facility], [place] [address], [place], [place] [address]    Report Status 12/23/2020 FINAL  Final  Resp Panel by  RT-PCR (Flu A&B, Covid) Nasopharyngeal Swab     Status: None   Collection Time: 12/18/20 11:27 AM   Specimen: Nasopharyngeal Swab; Nasopharyngeal(NP) swabs in vial transport medium  Result Value Ref Range Status   SARS Coronavirus 2 by RT PCR NEGATIVE NEGATIVE Final    Comment: (NOTE) SARS-CoV-2 target nucleic acids are NOT DETECTED.  The SARS-CoV-2 RNA is generally detectable in upper respiratory specimens during the acute phase of infection. The lowest concentration of SARS-CoV-2 viral copies this assay can detect is 138 copies/mL. A negative result does not preclude SARS-Cov-2 infection and should not be used as the sole basis for treatment or other patient management decisions. A negative result may occur with  improper specimen collection/handling, submission of specimen other than nasopharyngeal swab, presence of viral mutation(s) within the areas targeted by this assay, and inadequate number of viral copies(<138 copies/mL). A negative result must be combined with clinical observations, patient history, and epidemiological information. The expected result is Negative.  Fact Sheet for Patients:  EntrepreneurPulse.com.au  Fact Sheet for Healthcare Providers:  IncredibleEmployment.be  This test is no t yet approved or cleared by the [place] FDA and  has been authorized for detection and/or diagnosis of SARS-CoV-2 by FDA under an Emergency Use Authorization (EUA). This EUA will remain  in effect (meaning this test can be used) for the duration of the COVID-19 declaration under Section 564(b)(1) of the Act, 21 U.S.C.section 360bbb-3(b)(1), unless the authorization is terminated  or revoked sooner.       Influenza A by PCR NEGATIVE NEGATIVE Final   Influenza B by PCR NEGATIVE NEGATIVE Final    Comment: (NOTE) The Xpert Xpress SARS-CoV-2/FLU/RSV plus assay is intended as an aid in the diagnosis of influenza from Nasopharyngeal swab  specimens and should not be used as a sole basis for treatment. Nasal washings and aspirates are unacceptable for Xpert Xpress SARS-CoV-2/FLU/RSV testing.  Fact Sheet for Patients: EntrepreneurPulse.com.au  Fact Sheet for Healthcare Providers: IncredibleEmployment.be  This test is not yet approved or cleared by the [place] FDA and has been authorized for detection and/or diagnosis of SARS-CoV-2 by FDA under an Emergency Use Authorization (EUA). This EUA will remain in effect (meaning this test can be used) for the duration of the COVID-19 declaration under Section 564(b)(1) of the Act, 21 U.S.C. section 360bbb-3(b)(1), unless the authorization is  terminated or revoked.  Performed at [facility], [place] [address], [place], [place] [address]   C Difficile Quick Screen w PCR reflex     Status: None   Collection Time: 12/19/20 10:26 AM   Specimen: STOOL  Result Value Ref Range Status   C Diff antigen NEGATIVE NEGATIVE Final   C Diff toxin NEGATIVE NEGATIVE Final   C Diff interpretation No C. difficile detected.  Final    Comment: Performed at [facility], [place] [address], [place], [place] [address]     Radiology Studies: CT Chest High Resolution  Result Date: 12/23/2020 CLINICAL DATA:  75 year old male with history of respiratory failure. History of esophageal carcinoma and lung carcinoma. EXAM: CT CHEST WITHOUT CONTRAST TECHNIQUE: Multidetector CT imaging of the chest was performed following the standard protocol without intravenous contrast. High resolution imaging of the lungs, as well as inspiratory and expiratory imaging, was performed. COMPARISON:  No priors. FINDINGS: Cardiovascular: Heart size is normal. There is no significant pericardial fluid, thickening or pericardial calcification. There is aortic atherosclerosis, as well as atherosclerosis of the great vessels of the mediastinum and the coronary  arteries, including calcified atherosclerotic plaque in the left main, left anterior descending, left circumflex and right coronary arteries. Mild calcifications of the aortic valve. Moderate calcifications of the mitral annulus. Mediastinum/Nodes: Status post laryngectomy. Multiple prominent borderline enlarged and mildly enlarged mediastinal and hilar lymph nodes are noted, measuring up to 1.5 cm in short axis in the low right paratracheal nodal station. Esophagus is unremarkable in appearance. No axillary lymphadenopathy. Lungs/Pleura: Dependent areas of airspace consolidation are noted in the lungs bilaterally, most evident in the lower lobes, but also involving the dependent portions of the left upper lobe. Additional patchy areas of mild peribronchovascular ground-glass attenuation are noted, most evident in the right upper lobe. Scattered areas of thickening of the peribronchovascular interstitium. Small bilateral pleural effusions. Inspiratory and expiratory imaging is unremarkable. Retained secretions are noted in the trachea and right mainstem bronchus. Upper Abdomen: Aortic atherosclerosis.  Status post cholecystectomy. Musculoskeletal: There are no aggressive appearing lytic or blastic lesions noted in the visualized portions of the skeleton. IMPRESSION: 1. Dependent areas of airspace consolidation throughout the lungs bilaterally, concerning for potential aspiration pneumonia. Small bilateral parapneumonic pleural effusions. 2. Multiple prominent borderline enlarged and mildly enlarged mediastinal bilateral hilar lymph nodes, nonspecific in the setting of pneumonia. Attention on follow-up studies is recommended to ensure regression of these findings, particularly in light of the patient's history of malignancy. 3. Aortic atherosclerosis, in addition to left main and 3 vessel coronary artery disease. Assessment for potential risk factor modification, dietary therapy or pharmacologic therapy may be  warranted, if clinically indicated. 4. There are calcifications of the aortic valve and mitral annulus. Echocardiographic correlation for evaluation of potential valvular dysfunction may be warranted if clinically indicated. Aortic Atherosclerosis (ICD10-I70.0). Electronically Signed   By: Vinnie Langton M.D.   On: 12/23/2020 07:17    Scheduled Meds: . (feeding supplement) PROSource Plus  30 mL Oral BID BM  . aspirin EC  81 mg Oral Daily  . docusate sodium  100 mg Oral BID  . DULoxetine  20 mg Oral Daily  . enoxaparin (LOVENOX) injection  40 mg Subcutaneous Q24H  . feeding supplement  237 mL Oral BID BM  . gabapentin  300 mg Oral TID  . guaiFENesin  600 mg Oral BID  . levothyroxine  88 mcg Oral Daily  . methadone  5 mg Oral QID  .  metoprolol tartrate  25 mg Oral BID  . senna  1 tablet Oral BID  . cyanocobalamin  1,000 mcg Oral Daily   Continuous Infusions: . sodium chloride 10 mL/hr at 12/22/20 0600  . azithromycin Stopped (12/23/20 1937)     LOS: 6 days   Marylu Lund, MD [facility] Pager On Amion  If 7PM-7AM, please contact night-coverage 12/24/2020, 9:29 AM

## 2020-12-24 NOTE — Progress Notes (Signed)
SLP Cancellation Note  Patient Details Name: Marshal Eskew MRN: [id number] DOB: [birth year]   Cancelled treatment:       Reason Eval/Treat Not Completed: Other (comment) (per chart review, pt declines further swallow testing- ? concern for leak at TEP? - will sign off)  Kathleen Lime, MS [place] Office [phone] Pager [phone]   Macario Golds 12/24/2020, 3:32 PM

## 2020-12-24 NOTE — TOC Progression Note (Signed)
Transition of Care [facility]) - Progression Note    Patient Details  Name: Ryan Miles MRN: [id number] Date of Birth: [birth year]  Transition of Care [facility]) CM/SW Contact  Shakiyah Cirilo, Juliann Pulse, RN Phone Number: 12/24/2020, 10:37 AM  Clinical Narrative: If home 02 needed can arrange with documented 02 sats, & home 02 order.Active w/[facility] H[facility]N/PT/OT.           Expected Discharge Plan and Services                                                 Social Determinants of Health (SDOH) Interventions    Readmission Risk Interventions No flowsheet data found.

## 2020-12-24 NOTE — TOC Progression Note (Signed)
Transition of Care [facility]) - Progression Note    Patient Details  Name: Ryan Miles MRN: [id number] Date of Birth: 1946/03/30  Transition of Care [facility]) CM/SW Contact  Ross Ludwig, [place] Phone Number: 12/24/2020, 10:02 AM  Clinical Narrative:    Patient is currently active with [facility] for RN, PT, and OT.  Plan to continue with services once patient is medically ready for discharge.         Expected Discharge Plan and Services                                                 Social Determinants of Health (SDOH) Interventions    Readmission Risk Interventions No flowsheet data found.

## 2020-12-25 LAB — CREATININE, SERUM
Creatinine, Ser: 0.64 mg/dL (ref 0.61–1.24)
GFR, Estimated: >60 mL/min (ref >60)

## 2020-12-25 NOTE — Progress Notes (Signed)
SATURATION QUALIFICATIONS: (This note is used to comply with regulatory documentation for home oxygen)  Patient Saturations on Room Air at Rest = 93%  Patient Saturations on Room Air while Ambulating = N/A Pt Bedbound  Patient Saturations on Liters of oxygen while Ambulating = N/A %  Please briefly explain why patient needs home oxygen:

## 2020-12-25 NOTE — Care Management Important Message (Signed)
Important Message  Patient Details IM Letter given to the Patient. Name: Ryan Miles MRN: [id number] Date of Birth: Jul 24, 1946   Medicare Important Message Given:  Yes     Kerin Salen 12/25/2020, 11:02 AM

## 2020-12-25 NOTE — Progress Notes (Signed)
SATURATION QUALIFICATIONS: (This note is used to comply with regulatory documentation for home oxygen)  Patient Saturations on Room Air at Rest = 86%  Patient Saturations on Room Air while Ambulating = N/A Pt Bedbound%  Patient Saturations on N/A Liters of oxygen while Ambulating = N/A Pt bedbound  Please briefly explain why patient needs home oxygen:    Patient's Saturation 86% room air at rest Bed-bound.

## 2020-12-25 NOTE — TOC Transition Note (Signed)
Transition of Care [facility]) - CM/SW Discharge Note   Patient Details  Name: Ryan Miles MRN: [id number] Date of Birth: 1946/08/29  Transition of Care [facility]) CM/SW Contact:  Dessa Phi, RN Phone Number: 12/25/2020, 1:44 PM   Clinical Narrative:Patient has trach stoma-Qualified, & ordered for home 02- aerosol through trach collar-[employer] rep Thedore Mins has delivered home 02 travel tank to rm/[facility] services already ordered rep [facility] aware of d/c. Family to transport home. No further CM needs.      Final next level of care: [place] Barriers to Discharge: No Barriers Identified   Patient Goals and CMS Choice        Discharge Placement                       Discharge Plan and Services   Discharge Planning Services: CM Consult            DME Arranged: Oxygen DME Agency: [employer] Date DME Agency Contacted: 12/25/20 Time DME Agency Contacted: [phone] Representative spoke with at DME Agency: [place]: RN,PT,OT [place] Agency: Tat [place] ([place]) Date [place]: 12/25/20 Time [place]: 1344 Representative spoke with at [place]: [place] ([place]) Interventions     Readmission Risk Interventions No flowsheet data found.

## 2020-12-25 NOTE — Discharge Summary (Signed)
Physician Discharge Summary  Ryan Miles NAT:[id number] DOB: 15-Jun-1946 DOA: 12/18/2020  PCP: [name], MD  Admit date: 12/18/2020 Discharge date: 12/25/2020  Admitted From: Home Disposition:  Home  Recommendations for Outpatient Follow-up:  1. Follow up with PCP in 2-3 weeks 2. Follow up with ENT as scheduled  Discharge Condition:Stable CODE STATUS:Full Diet recommendation: Regular   Brief/Interim Summary: 75 year old male with medical history of esophageal carcinoma, lung carcinoma, hypertension, hypothyroidism presents with altered mental status. As per daughter patient has been having intermittent episodes of confusion and increasing weakness for past few days. Also has been talking nonsensical at times. He was supposed to have a OT visit but was having generalized weakness that was concerning. Therapist told that it was possible UTI which was causing all the symptoms. Patient fell out of bed so EMS was called and he was brought to ED. In the ED patient was found to be hypoxemic on room air, found to have bilateral pneumonia and AKI. Started on IV antibiotics and IV fluids.  Discharge Diagnoses:  Principal Problem:   Bilateral upper lobe community acquired pneumonia Active Problems:   Pressure injury of skin   Coronary artery disease involving native heart without angina pectoris   Atrial premature contractions   Sinus pause   Wandering pacemaker   Acute hypoxemic respiratory failure ([place])  1. Acute hypoxemic respiratory failure-secondary to bilateral lower lobe pneumonia, currently requiring5L/min oxygen via blow-by oxygen across tracheostomystoma. Blood culture is negative to date. Follow urine for Legionella, strep pneumo urinary antigen. Patient completed a course of Rocephin and Zithromax. WBC had been trending down.Appreciate input by Pulmonary - recommendation for arranging home O2 with close f/u with [facility]. Family to call ENT clinic for /fu.  Pulmonary had signed off  2. Bilateral lower lobe pneumonia-patient presented with acute hypoxemic respiratory failure, started on Rocephin and Zithromax. Repeat chest x-ray shows worsening of infiltrates. Pulmonology was consulted, CT chest high-resolution obtained which showed bibasilar infiltrates. Patient completed 7 day course of antibiotics. Per pulmonary, recommendation to arrange home O2 with [facility]  3. Altered mental status-resolved, likely from above. Significantly improved and since resolved. CT head was negative.  4. Questionable dysphagia-concern for aspiration pneumonia, speech therapy was consulted for swallow evaluation, patient has refused MBS and does not want PEG tube placement. Appreciate palliative care consult, continue current management.   5. Acute kidney injury-creatininewas1.65 on admission, has improved with IV fluids. Renal ultrasound without acute abnormality.  6. Hypertension-blood pressure is soft, home blood pressure medications including metoprolol, ramipril continues to be on hold.  7. Hypothyroidism-continue Synthroid  8. Constipation-continue home regimen including Colace, MiraLAX.  9. History of esophageal cancer/s/p laryngectomy-patient has post laryngectomy stoma, consultedrespiratory therapy for management of secretions.  10. Sinus pauses/wandering pacemaker/atrial bigeminy-patient's telemetry shows sinus pauses, atrial bigeminy, wandering pacemaker. Cardiology was consulted for further recommendations. Cardiology had recommended outpatient event monitor to further assess. No atrial fibrillation noted on telemetry. Cardiology has recommended not resuming BB given pauses 11. Tachycardia - Remained hemodynamically stable   Discharge Instructions   Allergies as of 12/25/2020      Reactions   Rosuvastatin    Other reaction(s): Headache, Other (See Comments) Entire body aches.   Atorvastatin    Other  reaction(s): Muscle Pain   Cephalexin    Other reaction(s): Other (See Comments) "fuzzy mouth"   Penicillins    Other reaction(s): Unknown      Medication List    STOP taking these medications  metoprolol tartrate 25 MG tablet Commonly known as: LOPRESSOR   ramipril 5 MG capsule Commonly known as: ALTACE     TAKE these medications   aspirin 81 MG EC tablet Take 81 mg by mouth daily.   cyanocobalamin 1000 MCG tablet Take 1,000 mcg by mouth daily.   DULoxetine 20 MG capsule Commonly known as: CYMBALTA Take 20 mg by mouth daily.   feeding supplement Liqd Take 237 mLs by mouth 2 (two) times daily between meals.   gabapentin 300 MG capsule Commonly known as: NEURONTIN Take 300 mg by mouth 4 (four) times daily.   levothyroxine 88 MCG tablet Commonly known as: SYNTHROID Take 88 mcg by mouth daily.   methadone 5 MG tablet Commonly known as: DOLOPHINE Take 5 mg by mouth 4 (four) times daily.   senna 8.6 MG tablet Commonly known as: SENOKOT Take 3-4 tablets by mouth at bedtime.            Durable Medical Equipment  (From admission, onward)         Start     Ordered   12/25/20 0909  For home use only DME oxygen  Once       Comments: Aerosolized trach colar  Question Answer Comment  Length of Need Lifetime   Liters per Minute 5   Frequency Continuous (stationary and portable oxygen unit needed)   Oxygen delivery system Other see comments      12/25/20 0910          Follow-up Information    [name], MD. Schedule an appointment as soon as possible for a visit in 2 week(s).   Specialty: Internal Medicine Contact information: [place] [address] [phone]        Follow up with your ENT specialist as scheduled. Schedule an appointment as soon as possible for a visit.              Allergies  Allergen Reactions  . Rosuvastatin     Other reaction(s): Headache, Other (See Comments) Entire body aches.  . Atorvastatin      Other reaction(s): Muscle Pain  . Cephalexin     Other reaction(s): Other (See Comments) "fuzzy mouth"  . Penicillins     Other reaction(s): Unknown    Consultations:  Pulmonary   Cardiology  Procedures/Studies: DG Chest 2 View  Result Date: 12/18/2020 CLINICAL DATA:  Dyspnea.  Fall. EXAM: CHEST - 2 VIEW COMPARISON:  12/13/2014 FINDINGS: Opacity at both lung bases. No Kerley lines, effusion, or pneumothorax seen. Normal heart size and aortic contours. Surgical clips overlap the thoracic inlet. IMPRESSION: Bilateral lower lobe infiltrate. Electronically Signed   By: Monte Fantasia M.D.   On: 12/18/2020 06:19   DG Thoracic Spine 2 View  Result Date: 12/18/2020 CLINICAL DATA:  Dyspnea and fall EXAM: THORACIC SPINE 2 VIEWS COMPARISON:  None. FINDINGS: No evidence of fracture or bone lesion. Mid and lower thoracic disc narrowing and spurring with 2 non segmented or ankylosed midthoracic vertebrae. No evident bone lesion. Lower lobe infiltrates described on dedicated chest x-ray. IMPRESSION: No acute finding. Electronically Signed   By: Monte Fantasia M.D.   On: 12/18/2020 06:21   DG Lumbar Spine Complete  Result Date: 12/18/2020 CLINICAL DATA:  Fall with dyspnea EXAM: LUMBAR SPINE - COMPLETE 4+ VIEW COMPARISON:  03/14/2017 FINDINGS: Five lumbar type vertebrae Advanced mid and lower lumbar disc degeneration with disc collapse and ridging that is progressed. Degenerative facet spurring in the lower lumbar spine. Mild L3-4  retrolisthesis, also seen on prior. Slight dextroscoliosis. No overt bone lesion or erosion. Prominent gas within colon and stool at the rectum. Atherosclerosis. IMPRESSION: 1. No acute finding. 2. Advanced lower lumbar disc degeneration that has progressed from 2018. 3. Prominent colonic gas and rectal stool. Electronically Signed   By: Monte Fantasia M.D.   On: 12/18/2020 06:23   DG Pelvis 1-2 Views  Result Date: 12/18/2020 CLINICAL DATA:  Fall with upper and lower  back pain. EXAM: PELVIS - 1-2 VIEW COMPARISON:  None. FINDINGS: Rotated pelvis with no evidence of fracture or subluxation. Lower lumbar spine degeneration. Prominent rectosigmoid stool with 10 cm transverse rectal span. The cecum is gas distended to 11 cm. IMPRESSION: 1. No acute finding in the pelvis. 2. Rectosigmoid distention by stool. Electronically Signed   By: Monte Fantasia M.D.   On: 12/18/2020 06:24   CT Head Wo Contrast  Result Date: 12/18/2020 CLINICAL DATA:  Fall with head trauma EXAM: CT HEAD WITHOUT CONTRAST CT CERVICAL SPINE WITHOUT CONTRAST TECHNIQUE: Multidetector CT imaging of the head and cervical spine was performed following the standard protocol without intravenous contrast. Multiplanar CT image reconstructions of the cervical spine were also generated. COMPARISON:  None. FINDINGS: CT HEAD FINDINGS Brain: No evidence of acute infarction, hemorrhage, hydrocephalus, extra-axial collection or mass lesion/mass effect. Generalized atrophy Vascular: No hyperdense vessel or unexpected calcification. Skull: Normal. Negative for fracture or focal lesion. Sinuses/Orbits: Negative CT CERVICAL SPINE FINDINGS Alignment: Dextroscoliosis. Skull base and vertebrae: No acute fracture Soft tissues and spinal canal: No prevertebral fluid or swelling. No visible canal hematoma. Laryngectomy and neck dissection with transesophageal puncture. Disc levels: Severe facet osteoarthritis with asymmetric bulky spurring on the left. Generalized disc degeneration focally advanced at C6-7. Upper chest: Negative IMPRESSION: No evidence of acute intracranial or cervical spine injury. Electronically Signed   By: Monte Fantasia M.D.   On: 12/18/2020 06:45   CT Cervical Spine Wo Contrast  Result Date: 12/18/2020 CLINICAL DATA:  Fall with head trauma EXAM: CT HEAD WITHOUT CONTRAST CT CERVICAL SPINE WITHOUT CONTRAST TECHNIQUE: Multidetector CT imaging of the head and cervical spine was performed following the standard  protocol without intravenous contrast. Multiplanar CT image reconstructions of the cervical spine were also generated. COMPARISON:  None. FINDINGS: CT HEAD FINDINGS Brain: No evidence of acute infarction, hemorrhage, hydrocephalus, extra-axial collection or mass lesion/mass effect. Generalized atrophy Vascular: No hyperdense vessel or unexpected calcification. Skull: Normal. Negative for fracture or focal lesion. Sinuses/Orbits: Negative CT CERVICAL SPINE FINDINGS Alignment: Dextroscoliosis. Skull base and vertebrae: No acute fracture Soft tissues and spinal canal: No prevertebral fluid or swelling. No visible canal hematoma. Laryngectomy and neck dissection with transesophageal puncture. Disc levels: Severe facet osteoarthritis with asymmetric bulky spurring on the left. Generalized disc degeneration focally advanced at C6-7. Upper chest: Negative IMPRESSION: No evidence of acute intracranial or cervical spine injury. Electronically Signed   By: Monte Fantasia M.D.   On: 12/18/2020 06:45   US RENAL  Result Date: 12/18/2020 CLINICAL DATA:  Acute kidney injury. EXAM: RENAL / URINARY TRACT ULTRASOUND COMPLETE COMPARISON:  None. FINDINGS: Right Kidney: Renal measurements: 8.1 x 3.7 x 4.0 cm = volume: 62 mL. Echogenicity within normal limits. No mass or hydronephrosis visualized. Left Kidney: Not visualized due to overlying bowel gas. Bladder: Appears normal for degree of bladder distention. Other: None. IMPRESSION: Normal right kidney. Left kidney not visualized due to overlying bowel gas. Electronically Signed   By: Marijo Conception M.D.   On: 12/18/2020 15:29  CT Chest High Resolution  Result Date: 12/23/2020 CLINICAL DATA:  75 year old male with history of respiratory failure. History of esophageal carcinoma and lung carcinoma. EXAM: CT CHEST WITHOUT CONTRAST TECHNIQUE: Multidetector CT imaging of the chest was performed following the standard protocol without intravenous contrast. High resolution imaging  of the lungs, as well as inspiratory and expiratory imaging, was performed. COMPARISON:  No priors. FINDINGS: Cardiovascular: Heart size is normal. There is no significant pericardial fluid, thickening or pericardial calcification. There is aortic atherosclerosis, as well as atherosclerosis of the great vessels of the mediastinum and the coronary arteries, including calcified atherosclerotic plaque in the left main, left anterior descending, left circumflex and right coronary arteries. Mild calcifications of the aortic valve. Moderate calcifications of the mitral annulus. Mediastinum/Nodes: Status post laryngectomy. Multiple prominent borderline enlarged and mildly enlarged mediastinal and hilar lymph nodes are noted, measuring up to 1.5 cm in short axis in the low right paratracheal nodal station. Esophagus is unremarkable in appearance. No axillary lymphadenopathy. Lungs/Pleura: Dependent areas of airspace consolidation are noted in the lungs bilaterally, most evident in the lower lobes, but also involving the dependent portions of the left upper lobe. Additional patchy areas of mild peribronchovascular ground-glass attenuation are noted, most evident in the right upper lobe. Scattered areas of thickening of the peribronchovascular interstitium. Small bilateral pleural effusions. Inspiratory and expiratory imaging is unremarkable. Retained secretions are noted in the trachea and right mainstem bronchus. Upper Abdomen: Aortic atherosclerosis.  Status post cholecystectomy. Musculoskeletal: There are no aggressive appearing lytic or blastic lesions noted in the visualized portions of the skeleton. IMPRESSION: 1. Dependent areas of airspace consolidation throughout the lungs bilaterally, concerning for potential aspiration pneumonia. Small bilateral parapneumonic pleural effusions. 2. Multiple prominent borderline enlarged and mildly enlarged mediastinal bilateral hilar lymph nodes, nonspecific in the setting of  pneumonia. Attention on follow-up studies is recommended to ensure regression of these findings, particularly in light of the patient's history of malignancy. 3. Aortic atherosclerosis, in addition to left main and 3 vessel coronary artery disease. Assessment for potential risk factor modification, dietary therapy or pharmacologic therapy may be warranted, if clinically indicated. 4. There are calcifications of the aortic valve and mitral annulus. Echocardiographic correlation for evaluation of potential valvular dysfunction may be warranted if clinically indicated. Aortic Atherosclerosis (ICD10-I70.0). Electronically Signed   By: Vinnie Langton M.D.   On: 12/23/2020 07:17   DG Chest Port 1 View  Result Date: 12/22/2020 CLINICAL DATA:  History of pneumonia.  Weakness. EXAM: PORTABLE CHEST 1 VIEW COMPARISON:  Chest x-ray 12/18/2020. FINDINGS: Bibasilar opacities may reflect areas of atelectasis and/or consolidation with superimposed small bilateral pleural effusions, overall slightly worse compared to the prior study. No pneumothorax. No evidence of pulmonary edema. Heart size is normal. Upper mediastinal contours are within normal limits. Aortic atherosclerosis. Surgical clips in the region of the thoracic inlet, likely from prior thyroidectomy. IMPRESSION: 1. Worsening bibasilar opacities which may reflect increasing areas of atelectasis and/or consolidation, with superimposed small bilateral pleural effusions which also appear increased. Electronically Signed   By: Vinnie Langton M.D.   On: 12/22/2020 09:03   DG Chest Port 1 View  Result Date: 12/18/2020 CLINICAL DATA:  Questionable sepsis EXAM: PORTABLE CHEST 1 VIEW COMPARISON:  None. FINDINGS: Infiltrates at the lung bases with small left pleural effusion. Normal heart size and mediastinal contours. Lucent appearance of the upper lungs but no generalized hyperinflation. Postoperative neck. IMPRESSION: Left more than right lower lobe infiltrates.  Electronically Signed   By: Angelica Chessman  Watts M.D.   On: 12/18/2020 07:09     Subjective: Eager to go home  Discharge Exam: Vitals:   12/25/20 0846 12/25/20 1044  BP:    Pulse: (!) 115   Resp:    Temp:    SpO2: 96% 93%   Vitals:   12/25/20 0314 12/25/20 0601 12/25/20 0846 12/25/20 1044  BP:  131/86    Pulse: (!) 109 96 (!) 115   Resp: 15 16    Temp:  98.1 F (36.7 C)    TempSrc:  Oral    SpO2:  94% 96% 93%  Weight:      Height:        General: Pt is alert, awake, not in acute distress Cardiovascular: RRR, S1/S2 +, no rubs, no gallops Respiratory: CTA bilaterally, no wheezing, no rhonchi Abdominal: Soft, NT, ND, bowel sounds + Extremities: no edema, no cyanosis   The results of significant diagnostics from this hospitalization (including imaging, microbiology, ancillary and laboratory) are listed below for reference.     Microbiology: Recent Results (from the past 240 hour(s))  Resp Panel by RT-PCR (Flu A&B, Covid) Urine, Catheterized     Status: None   Collection Time: 12/18/20  4:35 AM   Specimen: Urine, Catheterized; Nasopharyngeal(NP) swabs in vial transport medium  Result Value Ref Range Status   SARS Coronavirus 2 by RT PCR  NEGATIVE Corrected    QUESTIONABLE IDENTIFICATION / INCORRECTLY LABELED SPECIMEN    Comment: CORRECTED ON 03/17 AT 0656: PREVIOUSLY REPORTED AS NEGATIVE   Influenza A by PCR  NEGATIVE Corrected    QUESTIONABLE IDENTIFICATION / INCORRECTLY LABELED SPECIMEN    Comment: CORRECTED ON 03/17 AT 7425: PREVIOUSLY REPORTED AS NEGATIVE   Influenza B by PCR  NEGATIVE Corrected    QUESTIONABLE IDENTIFICATION / INCORRECTLY LABELED SPECIMEN    Comment: Performed at [facility], [place] [address] [place], [place] [address] CORRECTED ON 03/17 AT 7564: PREVIOUSLY REPORTED AS NEGATIVE   Urine culture     Status: None   Collection Time: 12/18/20  6:41 AM   Specimen: In/Out Cath Urine  Result Value Ref Range Status   Specimen  Description   Final    IN/OUT CATH URINE Performed at [facility] [address] [place], [place] [address]    Special Requests   Final    NONE Performed at [facility], [place] [address], [place], [place] [address]    Culture   Final    NO GROWTH Performed at [facility], [place] [address] [place], [place] [address]    Report Status 12/20/2020 FINAL  Final  Blood Culture (routine x 2)     Status: None   Collection Time: 12/18/20  7:05 AM   Specimen: Right Antecubital; Blood  Result Value Ref Range Status   Specimen Description   Final    RIGHT ANTECUBITAL Performed at [facility] [address], [place], [place] [address]    Special Requests   Final    BOTTLES DRAWN AEROBIC AND ANAEROBIC Blood Culture adequate volume Performed at [place] [address] [place], [place] [address]    Culture   Final    NO GROWTH 5 DAYS Performed at [facility], [place] [address], [place], [place] [address]    Report Status 12/23/2020 FINAL  Final  Blood Culture (routine x 2)     Status: None   Collection Time: 12/18/20  7:08 AM   Specimen: Left  Antecubital; Blood  Result Value Ref Range Status   Specimen Description   Final    LEFT ANTECUBITAL Performed at [facility] [address], [place], [place] [address]    Special Requests   Final    BOTTLES DRAWN AEROBIC AND ANAEROBIC Blood Culture adequate volume Performed at [facility] [address], [place], [place] [address]    Culture   Final    NO GROWTH 5 DAYS Performed at [facility], [place] [address], [place], [place] [address]    Report Status 12/23/2020 FINAL  Final  Resp Panel by RT-PCR (Flu A&B, Covid) Nasopharyngeal Swab     Status: None   Collection Time: 12/18/20 11:27 AM   Specimen: Nasopharyngeal Swab; Nasopharyngeal(NP) swabs in vial transport medium  Result Value Ref Range Status    SARS Coronavirus 2 by RT PCR NEGATIVE NEGATIVE Final    Comment: (NOTE) SARS-CoV-2 target nucleic acids are NOT DETECTED.  The SARS-CoV-2 RNA is generally detectable in upper respiratory specimens during the acute phase of infection. The lowest concentration of SARS-CoV-2 viral copies this assay can detect is 138 copies/mL. A negative result does not preclude SARS-Cov-2 infection and should not be used as the sole basis for treatment or other patient management decisions. A negative result may occur with  improper specimen collection/handling, submission of specimen other than nasopharyngeal swab, presence of viral mutation(s) within the areas targeted by this assay, and inadequate number of viral copies(<138 copies/mL). A negative result must be combined with clinical observations, patient history, and epidemiological information. The expected result is Negative.  Fact Sheet for Patients:  EntrepreneurPulse.com.au  Fact Sheet for Healthcare Providers:  IncredibleEmployment.be  This test is no t yet approved or cleared by the [place] FDA and  has been authorized for detection and/or diagnosis of SARS-CoV-2 by FDA under an Emergency Use Authorization (EUA). This EUA will remain  in effect (meaning this test can be used) for the duration of the COVID-19 declaration under Section 564(b)(1) of the Act, 21 U.S.C.section 360bbb-3(b)(1), unless the authorization is terminated  or revoked sooner.       Influenza A by PCR NEGATIVE NEGATIVE Final   Influenza B by PCR NEGATIVE NEGATIVE Final    Comment: (NOTE) The Xpert Xpress SARS-CoV-2/FLU/RSV plus assay is intended as an aid in the diagnosis of influenza from Nasopharyngeal swab specimens and should not be used as a sole basis for treatment. Nasal washings and aspirates are unacceptable for Xpert Xpress SARS-CoV-2/FLU/RSV testing.  Fact Sheet for  Patients: EntrepreneurPulse.com.au  Fact Sheet for Healthcare Providers: IncredibleEmployment.be  This test is not yet approved or cleared by the [place] FDA and has been authorized for detection and/or diagnosis of SARS-CoV-2 by FDA under an Emergency Use Authorization (EUA). This EUA will remain in effect (meaning this test can be used) for the duration of the COVID-19 declaration under Section 564(b)(1) of the Act, 21 U.S.C. section 360bbb-3(b)(1), unless the authorization is terminated or revoked.  Performed at [facility], [place] [address], [place],  [address]   C Difficile Quick Screen w PCR reflex     Status: None   Collection Time: 12/19/20 10:26 AM   Specimen: STOOL  Result Value Ref Range Status   C Diff antigen NEGATIVE NEGATIVE Final   C Diff toxin NEGATIVE NEGATIVE Final   C Diff interpretation No C. difficile detected.  Final    Comment: Performed at [facility], [place] [address]  [place], [place] [address]     Labs: BNP (last 3 results) Recent Labs    12/22/20 1551  BNP 211.9*   Basic Metabolic Panel: Recent Labs  Lab 12/18/20 2256 12/19/20 0507 12/20/20 0550 12/21/20 0555 12/25/20 0520  NA  --  138 139 138  --   K  --  3.9 3.9 3.9  --   CL  --  106 105 104  --   CO2  --  22 23 24   --   GLUCOSE  --  102* 87 82  --   BUN  --  48* 29* 18  --   CREATININE 1.10 1.04 0.87 0.58* 0.64  CALCIUM  --  7.8* 8.1* 7.9*  --    Liver Function Tests: Recent Labs  Lab 12/19/20 0507 12/20/20 0550 12/21/20 0555  AST 16 16 21   ALT 13 12 17   ALKPHOS 79 92 136*  BILITOT 1.0 0.9 0.8  PROT 5.9* 6.4* 6.0*  ALBUMIN 2.2* 2.5* 2.3*   No results for input(s): LIPASE, AMYLASE in the last 168 hours. No results for input(s): AMMONIA in the last 168 hours. CBC: Recent Labs  Lab 12/18/20 2256 12/19/20 0507 12/20/20 0550 12/21/20 0555  WBC 25.7* 19.7* 14.9* 11.8*  HGB 10.9* 9.5*  10.7* 10.7*  HCT 34.1* 29.6* 33.9* 34.2*  MCV 88.3 88.9 89.7 89.8  PLT 227 200 215 240   Cardiac Enzymes: No results for input(s): CKTOTAL, CKMB, CKMBINDEX, TROPONINI in the last 168 hours. BNP: Invalid input(s): POCBNP CBG: No results for input(s): GLUCAP in the last 168 hours. D-Dimer No results for input(s): DDIMER in the last 72 hours. Hgb A1c No results for input(s): HGBA1C in the last 72 hours. Lipid Profile No results for input(s): CHOL, HDL, LDLCALC, TRIG, CHOLHDL, LDLDIRECT in the last 72 hours. Thyroid function studies No results for input(s): TSH, T4TOTAL, T3FREE, THYROIDAB in the last 72 hours.  Invalid input(s): FREET3 Anemia work up No results for input(s): VITAMINB12, FOLATE, FERRITIN, TIBC, IRON, RETICCTPCT in the last 72 hours. Urinalysis    Component Value Date/Time   COLORURINE  12/18/2020 0432    QUESTIONABLE IDENTIFICATION / INCORRECTLY LABELED SPECIMEN   APPEARANCEUR  12/18/2020 0432    QUESTIONABLE IDENTIFICATION / INCORRECTLY LABELED SPECIMEN   LABSPEC  12/18/2020 0432    QUESTIONABLE IDENTIFICATION / INCORRECTLY LABELED SPECIMEN   PHURINE  12/18/2020 0432    QUESTIONABLE IDENTIFICATION / INCORRECTLY LABELED SPECIMEN   GLUCOSEU  12/18/2020 0432    QUESTIONABLE IDENTIFICATION / INCORRECTLY LABELED SPECIMEN   HGBUR  12/18/2020 0432    QUESTIONABLE IDENTIFICATION / INCORRECTLY LABELED SPECIMEN   BILIRUBINUR  12/18/2020 0432    QUESTIONABLE IDENTIFICATION / INCORRECTLY LABELED SPECIMEN   KETONESUR  12/18/2020 0432    QUESTIONABLE IDENTIFICATION / INCORRECTLY LABELED SPECIMEN   PROTEINUR  12/18/2020 0432    QUESTIONABLE IDENTIFICATION / INCORRECTLY LABELED SPECIMEN   NITRITE  12/18/2020 0432    QUESTIONABLE IDENTIFICATION / INCORRECTLY LABELED SPECIMEN   LEUKOCYTESUR  12/18/2020 0432    QUESTIONABLE IDENTIFICATION / INCORRECTLY LABELED SPECIMEN   Sepsis Labs Invalid input(s): PROCALCITONIN,  WBC,  LACTICIDVEN Microbiology Recent Results (from the  past 240 hour(s))  Resp Panel by RT-PCR (Flu A&B, Covid) Urine, Catheterized     Status: None   Collection Time: 12/18/20  4:35 AM   Specimen: Urine, Catheterized; Nasopharyngeal(NP) swabs in vial transport medium  Result Value Ref Range Status   SARS Coronavirus 2 by RT PCR  NEGATIVE Corrected    QUESTIONABLE IDENTIFICATION / INCORRECTLY LABELED SPECIMEN  Comment: CORRECTED ON 03/17 AT 0656: PREVIOUSLY REPORTED AS NEGATIVE   Influenza A by PCR  NEGATIVE Corrected    QUESTIONABLE IDENTIFICATION / INCORRECTLY LABELED SPECIMEN    Comment: CORRECTED ON 03/17 AT 2458: PREVIOUSLY REPORTED AS NEGATIVE   Influenza B by PCR  NEGATIVE Corrected    QUESTIONABLE IDENTIFICATION / INCORRECTLY LABELED SPECIMEN    Comment: Performed at [facility], [place] [address], [place], [place] [address] CORRECTED ON 03/17 AT 3825: PREVIOUSLY REPORTED AS NEGATIVE   Urine culture     Status: None   Collection Time: 12/18/20  6:41 AM   Specimen: In/Out Cath Urine  Result Value Ref Range Status   Specimen Description   Final    IN/OUT CATH URINE Performed at [place] [address], [place], [place] [address]    Special Requests   Final    NONE Performed at [facility], [place] [address], [place], [place] [address]    Culture   Final    NO GROWTH Performed at [facility], [place] [address] [place], [place] [address]    Report Status 12/20/2020 FINAL  Final  Blood Culture (routine x 2)     Status: None   Collection Time: 12/18/20  7:05 AM   Specimen: Right Antecubital; Blood  Result Value Ref Range Status   Specimen Description   Final    RIGHT ANTECUBITAL Performed at [place] [address] [place], [place] [address]    Special Requests   Final    BOTTLES DRAWN AEROBIC AND ANAEROBIC Blood Culture adequate volume Performed at [place] [address], [place], [place] [address]    Culture    Final    NO GROWTH 5 DAYS Performed at [facility], [place] [address], [place], [place] [address]    Report Status 12/23/2020 FINAL  Final  Blood Culture (routine x 2)     Status: None   Collection Time: 12/18/20  7:08 AM   Specimen: Left Antecubital; Blood  Result Value Ref Range Status   Specimen Description   Final    LEFT ANTECUBITAL Performed at [place] [address], [place], [place] [address]    Special Requests   Final    BOTTLES DRAWN AEROBIC AND ANAEROBIC Blood Culture adequate volume Performed at [place] [address] [place], [place] [address]    Culture   Final    NO GROWTH 5 DAYS Performed at [facility], [place] [address], [place], [place] [address]    Report Status 12/23/2020 FINAL  Final  Resp Panel by RT-PCR (Flu A&B, Covid) Nasopharyngeal Swab     Status: None   Collection Time: 12/18/20 11:27 AM   Specimen: Nasopharyngeal Swab; Nasopharyngeal(NP) swabs in vial transport medium  Result Value Ref Range Status   SARS Coronavirus 2 by RT PCR NEGATIVE NEGATIVE Final    Comment: (NOTE) SARS-CoV-2 target nucleic acids are NOT DETECTED.  The SARS-CoV-2 RNA is generally detectable in upper respiratory specimens during the acute phase of infection. The lowest concentration of SARS-CoV-2 viral copies this assay can detect is 138 copies/mL. A negative result does not preclude SARS-Cov-2 infection and should not be used as the sole basis for treatment or other patient management decisions. A negative result may occur with  improper specimen collection/handling, submission of specimen other than nasopharyngeal swab, presence of viral mutation(s) within the areas targeted by this assay, and  inadequate number of viral copies(<138 copies/mL). A negative result must be combined with clinical observations, patient history, and epidemiological information. The expected result is Negative.  Fact Sheet for  Patients:  EntrepreneurPulse.com.au  Fact Sheet for Healthcare Providers:  IncredibleEmployment.be  This test is no t yet approved or cleared by the [place] FDA and  has been authorized for detection and/or diagnosis of SARS-CoV-2 by FDA under an Emergency Use Authorization (EUA). This EUA will remain  in effect (meaning this test can be used) for the duration of the COVID-19 declaration under Section 564(b)(1) of the Act, 21 U.S.C.section 360bbb-3(b)(1), unless the authorization is terminated  or revoked sooner.       Influenza A by PCR NEGATIVE NEGATIVE Final   Influenza B by PCR NEGATIVE NEGATIVE Final    Comment: (NOTE) The Xpert Xpress SARS-CoV-2/FLU/RSV plus assay is intended as an aid in the diagnosis of influenza from Nasopharyngeal swab specimens and should not be used as a sole basis for treatment. Nasal washings and aspirates are unacceptable for Xpert Xpress SARS-CoV-2/FLU/RSV testing.  Fact Sheet for Patients: EntrepreneurPulse.com.au  Fact Sheet for Healthcare Providers: IncredibleEmployment.be  This test is not yet approved or cleared by the [place] FDA and has been authorized for detection and/or diagnosis of SARS-CoV-2 by FDA under an Emergency Use Authorization (EUA). This EUA will remain in effect (meaning this test can be used) for the duration of the COVID-19 declaration under Section 564(b)(1) of the Act, 21 U.S.C. section 360bbb-3(b)(1), unless the authorization is terminated or revoked.  Performed at [facility], [place] [address], [place], [place] [address]   C Difficile Quick Screen w PCR reflex     Status: None   Collection Time: 12/19/20 10:26 AM   Specimen: STOOL  Result Value Ref Range Status   C Diff antigen NEGATIVE NEGATIVE Final   C Diff toxin NEGATIVE NEGATIVE Final   C Diff interpretation No C. difficile detected.  Final    Comment:  Performed at [facility], [place] [address] [place], [place] [address]   Time spent: 51min  SIGNED:   Marylu Lund, MD  [facility] 12/25/2020, 11:30 AM  If 7PM-7AM, please contact night-coverage

## 2020-12-30 ENCOUNTER — Other Ambulatory Visit: Payer: Self-pay | Admitting: Internal Medicine

## 2020-12-30 DIAGNOSIS — J9621 Acute and chronic respiratory failure with hypoxia: Secondary | ICD-10-CM

## 2021-01-08 ENCOUNTER — Inpatient Hospital Stay (HOSPITAL_COMMUNITY)
Admission: EM | Admit: 2021-01-08 | Discharge: 2021-01-12 | DRG: 871 | Disposition: A | Discharge status: Hospice | Payer: Medicare Other | Attending: Internal Medicine | Admitting: Internal Medicine

## 2021-01-08 ENCOUNTER — Emergency Department (HOSPITAL_COMMUNITY): Payer: Medicare Other

## 2021-01-08 DIAGNOSIS — I10 Essential (primary) hypertension: Secondary | ICD-10-CM | POA: Diagnosis present

## 2021-01-08 DIAGNOSIS — R64 Cachexia: Secondary | ICD-10-CM | POA: Diagnosis present

## 2021-01-08 DIAGNOSIS — D638 Anemia in other chronic diseases classified elsewhere: Secondary | ICD-10-CM | POA: Diagnosis present

## 2021-01-08 DIAGNOSIS — Z79890 Hormone replacement therapy (postmenopausal): Secondary | ICD-10-CM

## 2021-01-08 DIAGNOSIS — Z7982 Long term (current) use of aspirin: Secondary | ICD-10-CM

## 2021-01-08 DIAGNOSIS — Z515 Encounter for palliative care: Secondary | ICD-10-CM

## 2021-01-08 DIAGNOSIS — I2510 Atherosclerotic heart disease of native coronary artery without angina pectoris: Secondary | ICD-10-CM | POA: Diagnosis present

## 2021-01-08 DIAGNOSIS — Z930 Tracheostomy status: Secondary | ICD-10-CM

## 2021-01-08 DIAGNOSIS — Z7189 Other specified counseling: Secondary | ICD-10-CM | POA: Diagnosis not present

## 2021-01-08 DIAGNOSIS — Y95 Nosocomial condition: Secondary | ICD-10-CM | POA: Diagnosis present

## 2021-01-08 DIAGNOSIS — A411 Sepsis due to other specified staphylococcus: Secondary | ICD-10-CM | POA: Diagnosis present

## 2021-01-08 DIAGNOSIS — C329 Malignant neoplasm of larynx, unspecified: Secondary | ICD-10-CM | POA: Diagnosis not present

## 2021-01-08 DIAGNOSIS — Z955 Presence of coronary angioplasty implant and graft: Secondary | ICD-10-CM | POA: Diagnosis not present

## 2021-01-08 DIAGNOSIS — Z85118 Personal history of other malignant neoplasm of bronchus and lung: Secondary | ICD-10-CM

## 2021-01-08 DIAGNOSIS — Z881 Allergy status to other antibiotic agents status: Secondary | ICD-10-CM | POA: Diagnosis not present

## 2021-01-08 DIAGNOSIS — Z20822 Contact with and (suspected) exposure to covid-19: Secondary | ICD-10-CM | POA: Diagnosis present

## 2021-01-08 DIAGNOSIS — E8809 Other disorders of plasma-protein metabolism, not elsewhere classified: Secondary | ICD-10-CM | POA: Diagnosis present

## 2021-01-08 DIAGNOSIS — F419 Anxiety disorder, unspecified: Secondary | ICD-10-CM | POA: Diagnosis present

## 2021-01-08 DIAGNOSIS — K5900 Constipation, unspecified: Secondary | ICD-10-CM | POA: Diagnosis present

## 2021-01-08 DIAGNOSIS — Z8521 Personal history of malignant neoplasm of larynx: Secondary | ICD-10-CM

## 2021-01-08 DIAGNOSIS — Z880 Allergy status to penicillin: Secondary | ICD-10-CM | POA: Diagnosis not present

## 2021-01-08 DIAGNOSIS — G8929 Other chronic pain: Secondary | ICD-10-CM | POA: Diagnosis present

## 2021-01-08 DIAGNOSIS — Z66 Do not resuscitate: Secondary | ICD-10-CM | POA: Diagnosis present

## 2021-01-08 DIAGNOSIS — J918 Pleural effusion in other conditions classified elsewhere: Secondary | ICD-10-CM | POA: Diagnosis present

## 2021-01-08 DIAGNOSIS — J9621 Acute and chronic respiratory failure with hypoxia: Secondary | ICD-10-CM | POA: Diagnosis present

## 2021-01-08 DIAGNOSIS — G9341 Metabolic encephalopathy: Secondary | ICD-10-CM | POA: Diagnosis present

## 2021-01-08 DIAGNOSIS — Z87891 Personal history of nicotine dependence: Secondary | ICD-10-CM

## 2021-01-08 DIAGNOSIS — Z888 Allergy status to other drugs, medicaments and biological substances status: Secondary | ICD-10-CM | POA: Diagnosis not present

## 2021-01-08 DIAGNOSIS — R627 Adult failure to thrive: Secondary | ICD-10-CM | POA: Diagnosis present

## 2021-01-08 DIAGNOSIS — I252 Old myocardial infarction: Secondary | ICD-10-CM | POA: Diagnosis not present

## 2021-01-08 DIAGNOSIS — J189 Pneumonia, unspecified organism: Secondary | ICD-10-CM

## 2021-01-08 DIAGNOSIS — Z79899 Other long term (current) drug therapy: Secondary | ICD-10-CM

## 2021-01-08 DIAGNOSIS — E43 Unspecified severe protein-calorie malnutrition: Secondary | ICD-10-CM | POA: Insufficient documentation

## 2021-01-08 DIAGNOSIS — Z9889 Other specified postprocedural states: Secondary | ICD-10-CM

## 2021-01-08 DIAGNOSIS — J90 Pleural effusion, not elsewhere classified: Secondary | ICD-10-CM

## 2021-01-08 DIAGNOSIS — Z79891 Long term (current) use of opiate analgesic: Secondary | ICD-10-CM | POA: Diagnosis not present

## 2021-01-08 DIAGNOSIS — Z6821 Body mass index (BMI) 21.0-21.9, adult: Secondary | ICD-10-CM

## 2021-01-08 DIAGNOSIS — J9601 Acute respiratory failure with hypoxia: Secondary | ICD-10-CM

## 2021-01-08 DIAGNOSIS — Z902 Acquired absence of lung [part of]: Secondary | ICD-10-CM

## 2021-01-08 DIAGNOSIS — Z9002 Acquired absence of larynx: Secondary | ICD-10-CM

## 2021-01-08 LAB — COMPREHENSIVE METABOLIC PANEL
ALT: 9 U/L (ref 0–44)
AST: 13 U/L — ABNORMAL LOW (ref 15–41)
Albumin: 2.2 g/dL — ABNORMAL LOW (ref 3.5–5.0)
Alkaline Phosphatase: 106 U/L (ref 38–126)
Anion gap: 7 (ref 5–15)
BUN: 11 mg/dL (ref 8–23)
CO2: 29 mmol/L (ref 22–32)
Calcium: 8.5 mg/dL — ABNORMAL LOW (ref 8.9–10.3)
Chloride: 102 mmol/L (ref 98–111)
Creatinine, Ser: 0.89 mg/dL (ref 0.61–1.24)
GFR, Estimated: >60 mL/min (ref >60)
Glucose, Bld: 110 mg/dL — ABNORMAL HIGH (ref 70–99)
Potassium: 4.5 mmol/L (ref 3.5–5.1)
Sodium: 138 mmol/L (ref 135–145)
Total Bilirubin: 0.9 mg/dL (ref 0.3–1.2)
Total Protein: 7.1 g/dL (ref 6.5–8.1)

## 2021-01-08 LAB — CBC WITH DIFFERENTIAL/PLATELET
Abs Immature Granulocytes: 0.56 10*3/uL — ABNORMAL HIGH (ref 0.00–0.07)
Basophils Absolute: 0 10*3/uL (ref 0.0–0.1)
Basophils Relative: 0 %
Eosinophils Absolute: 0 10*3/uL (ref 0.0–0.5)
Eosinophils Relative: 0 %
HCT: 38.7 % — ABNORMAL LOW (ref 39.0–52.0)
Hemoglobin: 12.2 g/dL — ABNORMAL LOW (ref 13.0–17.0)
Immature Granulocytes: 5 %
Lymphocytes Relative: 11 %
Lymphs Abs: 1.2 10*3/uL (ref 0.7–4.0)
MCH: 28.7 pg (ref 26.0–34.0)
MCHC: 31.5 g/dL (ref 30.0–36.0)
MCV: 91.1 fL (ref 80.0–100.0)
Monocytes Absolute: 1.1 10*3/uL — ABNORMAL HIGH (ref 0.1–1.0)
Monocytes Relative: 10 %
Neutro Abs: 8.1 10*3/uL — ABNORMAL HIGH (ref 1.7–7.7)
Neutrophils Relative %: 74 %
Platelets: 327 10*3/uL (ref 150–400)
RBC: 4.25 MIL/uL (ref 4.22–5.81)
RDW: 17.2 % — ABNORMAL HIGH (ref 11.5–15.5)
WBC: 11 10*3/uL — ABNORMAL HIGH (ref 4.0–10.5)
nRBC: 0 % (ref 0.0–0.2)

## 2021-01-08 LAB — RESP PANEL BY RT-PCR (FLU A&B, COVID) ARPGX2
Influenza A by PCR: NEGATIVE
Influenza B by PCR: NEGATIVE
SARS Coronavirus 2 by RT PCR: NEGATIVE

## 2021-01-08 LAB — LACTIC ACID, PLASMA
Lactic Acid, Venous: 1.3 mmol/L (ref 0.5–1.9)
Lactic Acid, Venous: 1.7 mmol/L (ref 0.5–1.9)

## 2021-01-08 MED ORDER — LACTATED RINGERS IV BOLUS (SEPSIS): 1000.0000 mL | Freq: Once | INTRAVENOUS | Status: DC

## 2021-01-08 MED ORDER — VANCOMYCIN HCL 1250 MG/250ML IV SOLN: 1250.0000 mg | INTRAVENOUS | Status: DC

## 2021-01-08 MED ORDER — ENOXAPARIN SODIUM 40 MG/0.4ML ~~LOC~~ SOLN
40.0000 mg | SUBCUTANEOUS | Status: DC
  Administered 2021-01-09 – 2021-01-11 (×4): 40 mg via SUBCUTANEOUS
  Filled 2021-01-08 (×4): qty 0.4

## 2021-01-08 MED ORDER — SODIUM CHLORIDE 0.9 % IV SOLN
2.0000 g | Freq: Two times a day (BID) | INTRAVENOUS | Status: DC
  Administered 2021-01-09: 2 g via INTRAVENOUS
  Filled 2021-01-08 (×2): qty 2

## 2021-01-08 MED ORDER — SODIUM CHLORIDE 0.9 % IV SOLN: INTRAVENOUS | Status: DC

## 2021-01-08 MED ORDER — SODIUM CHLORIDE 0.9 % IV SOLN
2.0000 g | Freq: Once | INTRAVENOUS | Status: AC
  Administered 2021-01-08: 2 g via INTRAVENOUS
  Filled 2021-01-08: qty 2

## 2021-01-08 MED ORDER — ACETAMINOPHEN 325 MG PO TABS
650.0000 mg | ORAL_TABLET | Freq: Once | ORAL | Status: AC
  Administered 2021-01-08: 650 mg via ORAL
  Filled 2021-01-08: qty 2

## 2021-01-08 MED ORDER — SODIUM CHLORIDE 0.9 % IV SOLN: 2.0000 g | Freq: Once | INTRAVENOUS | Status: DC

## 2021-01-08 MED ORDER — SODIUM CHLORIDE 0.9 % IV BOLUS: 1000.0000 mL | Freq: Once | INTRAVENOUS | Status: DC

## 2021-01-08 MED ORDER — LEVOFLOXACIN IN D5W 750 MG/150ML IV SOLN
750.0000 mg | INTRAVENOUS | Status: DC
  Administered 2021-01-09: 750 mg via INTRAVENOUS
  Filled 2021-01-08: qty 150

## 2021-01-08 MED ORDER — LACTATED RINGERS IV SOLN: INTRAVENOUS | Status: DC

## 2021-01-08 MED ORDER — VANCOMYCIN HCL 1500 MG/300ML IV SOLN
1500.0000 mg | Freq: Once | INTRAVENOUS | Status: AC
  Administered 2021-01-08: 1500 mg via INTRAVENOUS
  Filled 2021-01-08: qty 300

## 2021-01-08 MED ORDER — LACTATED RINGERS IV BOLUS (SEPSIS)
1000.0000 mL | Freq: Once | INTRAVENOUS | Status: AC
  Administered 2021-01-08: 1000 mL via INTRAVENOUS

## 2021-01-08 MED ORDER — SODIUM CHLORIDE 0.9 % IV BOLUS
1000.0000 mL | Freq: Once | INTRAVENOUS | Status: AC
  Administered 2021-01-08: 1000 mL via INTRAVENOUS

## 2021-01-08 NOTE — Sepsis Progress Note (Signed)
Following for Code Sepsis  

## 2021-01-08 NOTE — ED Triage Notes (Signed)
BIB EMS from home recent hospitalization for pneumonia. Patient with trach and trach collar at 5 l/min of humidified O2 at baseline. Daughter who is primary caretaker was worried at the reading of 80's on pulse ox, but EMS only saw 90-95% which is normal for him. Patient with recent urinary incontinence from hand weakness that started with the hospitalization. Recently placed on Hospice. Patient with no respiratory distress at this time. Able to mouth words and is oriented.

## 2021-01-08 NOTE — Progress Notes (Signed)
Patient arrived to room 2w18 from ED.  Assessment complete, VS obtained, and Admission database began.  Daughter at bedside with updated medication list.

## 2021-01-08 NOTE — ED Provider Notes (Signed)
[facility] EMERGENCY DEPARTMENT Provider Note   CSN: [id number] Arrival date & time: 01/08/21  1557     History Chief Complaint  Patient presents with  . hypoxia    Ryan Miles is a 75 y.o. male.  HPI  Level 54 caveat 75 year old male with a history of CAD, laryngeal and cancer with tracheostomy, spinal stenosis, STEMI presents to the ER via EMS with reported hypoxia.  Patient able to provide a history, however mouths words.  Reportedly patient's daughter noted that the patient was hypoxic with sats in the mid 80s.  He was recently discharged from the hospital on 3/24 with community-acquired pneumonia, finished a course of Rocephin and Zithromax.  Discharged with follow-up with the [place], states he overall feels well.  Denies any known fevers or chills.  Has been stable on the 5 L/min oxygen via blow-by oxygen across the tracheostomy stoma.  Overall reports feeling well, no no shortness of breath, chest pain, weakness.    Past Medical History:  Diagnosis Date  . Coronary artery disease involving native coronary artery 03/2012   -100% RCA occluded-DES x2 [facility])  . 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 Hypertension   . Lung cancer ([place])   . Spinal stenosis   . 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 Acute hypoxemic respiratory failure ([place])   . Pressure injury of skin 12/19/2020  . Atrial premature contractions 12/19/2020  . Sinus pause 12/19/2020  . 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 Bilateral upper lobe community acquired pneumonia 12/18/2020  . Coronary artery disease involving native heart without angina pectoris 02/19/2012    Past Surgical History:  Procedure Laterality Date  . CORONARY STENT PLACEMENT  03/2012   [facility]: DES x2 to RCA.  Marland Kitchen LEFT HEART CATH AND CORONARY ANGIOGRAPHY   03/2012   ([facility])-inferior STEMI, 100% RCA.  Marland Kitchen LUNG LOBECTOMY Right 02/2015   Right lower lobe ([place])  . TOTAL LARYNGECTOMY    . TRANSTHORACIC ECHOCARDIOGRAM  01/22/2020   EF estimated greater than 55%.  Mild LV thickening.  Normal RV function.  Moderate MR.  Otherwise trivial AI, PRN TR.  Otherwise normal       Family History  Family history unknown: Yes    Social History   Tobacco Use  . Smoking status: Former Research scientist (life sciences)  . Smokeless tobacco: Never Used  Vaping Use  . Vaping Use: Never used  Substance Use Topics  . Alcohol use: Never  . Drug use: Never    Home Medications Prior to Admission medications   Medication Sig Start Date End Date Taking? Authorizing Provider  aspirin 81 MG EC tablet Take 81 mg by mouth daily. 06/09/12   [provider]  cyanocobalamin 1000 MCG tablet Take 1,000 mcg by mouth daily. 06/20/20   [provider]  DULoxetine (CYMBALTA) 20 MG capsule Take 20 mg by mouth daily. 11/10/20   [provider]  feeding supplement (ENSURE ENLIVE / ENSURE PLUS) LIQD Take 237 mLs by mouth 2 (two) times daily between meals.    [provider]  gabapentin (NEURONTIN) 300 MG capsule Take 300 mg by mouth 4 (four) times daily. 11/10/20   [provider]  levothyroxine (SYNTHROID) 88 MCG tablet Take 88 mcg by mouth daily. 06/20/20   [provider]  methadone (DOLOPHINE) 5  MG tablet Take 5 mg by mouth 4 (four) times daily. 11/28/20   [provider]  senna (SENOKOT) 8.6 MG tablet Take 3-4 tablets by mouth at bedtime.    [provider]    Allergies    Rosuvastatin, Atorvastatin, Cephalexin, and Penicillins  Review of Systems   Review of Systems  Constitutional: Negative for chills and fever.  HENT: Negative for ear pain and sore throat.   Eyes: Negative for pain and visual disturbance.  Respiratory: Negative for cough and shortness of breath.   Cardiovascular: Negative for chest pain and palpitations.   Gastrointestinal: Negative for abdominal pain and vomiting.  Genitourinary: Negative for dysuria and hematuria.  Musculoskeletal: Negative for arthralgias and back pain.  Skin: Negative for color change and rash.  Neurological: Negative for seizures and syncope.  All other systems reviewed and are negative.   Physical Exam Updated Vital Signs BP 121/67   Pulse (!) 127   Temp (!) 100.6 F (38.1 C) (Rectal) Comment: RN aware  Resp 16   Ht 5\' 7"  (1.702 m)   Wt 62.1 kg Comment: From 12/18/20 clinic visit  SpO2 100%   BMI 21.44 kg/m   Physical Exam Vitals and nursing note reviewed.  Constitutional:      General: He is not in acute distress.    Appearance: He is well-developed. He is ill-appearing (Chronically ill-appearing). He is not toxic-appearing or diaphoretic.  HENT:     Head: Normocephalic and atraumatic.  Eyes:     Conjunctiva/sclera: Conjunctivae normal.  Neck:     Comments: Tracheostomy stoma without evidence of infection Cardiovascular:     Rate and Rhythm: Normal rate and regular rhythm.     Heart sounds: No murmur heard.   Pulmonary:     Effort: Pulmonary effort is normal. No respiratory distress.     Breath sounds: Rhonchi present.     Comments: Scattered rhonchi in left upper and lower lobes Abdominal:     General: Abdomen is flat.     Palpations: Abdomen is soft.     Tenderness: There is no abdominal tenderness.  Musculoskeletal:        General: Normal range of motion.     Cervical back: Neck supple.     Right lower leg: No edema.     Left lower leg: No edema.  Skin:    General: Skin is warm and dry.  Neurological:     General: No focal deficit present.     Mental Status: He is alert and oriented to person, place, and time.     ED Results / Procedures / Treatments   Labs (all labs ordered are listed, but only abnormal results are displayed) Labs Reviewed  CBC WITH DIFFERENTIAL/PLATELET - Abnormal; Notable for the following components:       Result Value   WBC 11.0 (*)    Hemoglobin 12.2 (*)    HCT 38.7 (*)    RDW 17.2 (*)    Neutro Abs 8.1 (*)    Monocytes Absolute 1.1 (*)    Abs Immature Granulocytes 0.56 (*)    All other components within normal limits  COMPREHENSIVE METABOLIC PANEL - Abnormal; Notable for the following components:   Glucose, Bld 110 (*)    Calcium 8.5 (*)    Albumin 2.2 (*)    AST 13 (*)    All other components within normal limits  CULTURE, BLOOD (ROUTINE X 2)  CULTURE, BLOOD (ROUTINE X 2)  RESP PANEL BY RT-PCR (FLU A&B, COVID)  ARPGX2  LACTIC ACID, PLASMA  LACTIC ACID, PLASMA    EKG None  Radiology DG Chest Portable 1 View  Result Date: 01/08/2021 CLINICAL DATA:  Tachycardia. EXAM: PORTABLE CHEST 1 VIEW COMPARISON:  December 22, 2020 FINDINGS: Marked severity infiltrate is seen throughout the left lower lobe and suprahilar region of the left upper lobe. This is increased in severity when compared to the prior study. Mild left-sided volume loss is noted. Stable mild to moderate severity right basilar atelectasis and/or infiltrate is seen. A moderate sized left-sided pleural effusion is present. This is also increased in size. No pneumothorax is identified. The heart size and mediastinal contours are within normal limits. Marked severity calcification of the thoracic aorta is seen. Radiopaque surgical clips are seen overlying the soft tissues of the neck. The visualized skeletal structures are unremarkable. IMPRESSION: 1. Marked severity infiltrate left upper lobe and left lower lobe infiltrates, increased in severity when compared to the prior study. 2. Stable mild to moderate severity right basilar atelectasis and/or infiltrate. 3. Moderate size left pleural effusion. Electronically Signed   By: Virgina Norfolk M.D.   On: 01/08/2021 16:52    Procedures Procedures   Medications Ordered in ED Medications  vancomycin (VANCOREADY) IVPB 1500 mg/300 mL (has no administration in time range)   acetaminophen (TYLENOL) tablet 650 mg (has no administration in time range)  lactated ringers infusion (has no administration in time range)  lactated ringers bolus 1,000 mL (has no administration in time range)    And  lactated ringers bolus 1,000 mL (has no administration in time range)  sodium chloride 0.9 % bolus 1,000 mL (1,000 mLs Intravenous New Bag/Given 01/08/21 1824)  ceFEPIme (MAXIPIME) 2 g in sodium chloride 0.9 % 100 mL IVPB (0 g Intravenous Stopped 01/08/21 1927)    ED Course  I have reviewed the triage vital signs and the nursing notes.  Pertinent labs & imaging results that were available during my care of the patient were reviewed by me and considered in my medical decision making (see chart for details).    MDM Rules/Calculators/A&P                         75 year old male who presents to the ER with reported hypoxia.  On arrival, he is medically ill-appearing, however not tachypneic or any evidence of respiratory distress.  On arrival, he was borderline febrile with a temperature of 91.3, tachycardic at 119.  No evidence of hypotension, no evidence of hypoxia here with sats at 93% with his supplemental 5L supplemental O2.  Lung sounds on exam with scattered rhonchi in the left upper and left lower lobes.  Labs and imaging ordered, reviewed and interpreted by me. CBC with a leukocytosis of 11, CMP without any acute abnormalities.  Initial lactic acid 1.3.  Chest x-ray marked severity infiltrate in the left upper and lower lungs as well as a moderate pleural effusion.  EKG sinus tach.  Had a rectal temperature done by nursing staff and was febrile with a temperature of 100.6.  This made him qualifies for SIRS criteria and a code sepsis was initiated.  Initiated vancomycin and cefepime as well as fluid resuscitation.  It was initially resistant to admission, however given clinical picture in the setting of worsening pneumonia/questionable new onset hospital-acquired  pneumonia, I did urge the patient for admission.  Patient decided to transition to hospice yesterday but has not spoken with anyone and does not have a hospice team established.  I had a lengthy discussion with the patient and his daughter and the patient is agreeable for admission at this time.  He would likely benefit from hospice consultation as an inpatient.  Consulted hospitalist team.   Consulted Dr. Jonelle Sidle who will admit the patient for further evaluation and treatment.  This was a shared visit with my supervising physician Dr. Jeanell Sparrow who independently saw and evaluated the patient & provided guidance in evaluation/management/disposition ,in agreement with care   Final Clinical Impression(s) / ED Diagnoses Final diagnoses:  Hospital-acquired pneumonia    Rx / DC Orders ED Discharge Orders    None       Lyndel Safe 01/08/21 1930    Pattricia Boss, MD 01/10/21 1609

## 2021-01-08 NOTE — Progress Notes (Addendum)
Pharmacy Antibiotic Note  Ryan Miles is a 75 y.o. male admitted on 01/08/2021 with pneumonia. Patient recently hospitalized for pneumonia and completed 7 days of azithromycin and ceftriaxone. Pharmacy has been consulted for vancomycin dosing.  Plan: Cefepime 2g IV q8h Vancomycin 1500 mg IV x1, then vancomycin 1250 mg IV q24h - Monitor clinical improvement, LOT, and de-escalation  - Obtain vancomycin levels as clinically indicated   Height: 5\' 7"  (170.2 cm) Weight: 62.1 kg (136 lb 14.5 oz) (From 12/18/20 clinic visit) IBW/kg (Calculated) : 66.1  Temp (24hrs), Avg:99.3 F (37.4 C), Min:99.3 F (37.4 C), Max:99.3 F (37.4 C)  No results for input(s): WBC, CREATININE, LATICACIDVEN, VANCOTROUGH, VANCOPEAK, VANCORANDOM, GENTTROUGH, GENTPEAK, GENTRANDOM, TOBRATROUGH, TOBRAPEAK, TOBRARND, AMIKACINPEAK, AMIKACINTROU, AMIKACIN in the last 168 hours.  Estimated Creatinine Clearance: 69 mL/min (by C-G formula based on SCr of 0.64 mg/dL).    Allergies  Allergen Reactions  . Rosuvastatin     Other reaction(s): Headache, Other (See Comments) Entire body aches.  . Atorvastatin     Other reaction(s): Muscle Pain  . Cephalexin     Other reaction(s): Other (See Comments) "fuzzy mouth"  . Penicillins     Other reaction(s): Unknown    Antimicrobials this admission: Cefepime 4/7 >>  Vancomycin 4/7 >>  Thank you for allowing pharmacy to be a part of this patient's care.  Claudina Lick, PharmD PGY1 Acute Care Pharmacy Resident 01/08/2021 5:37 PM  Please check [email or web address] for unit-specific pharmacy phone numbers.

## 2021-01-08 NOTE — H&P (Signed)
History and Physical   Ryan Miles LSL:[id number] DOB: 1946-06-16 DOA: 01/08/2021  Referring MD/NP/PA: Dr. Kathrynn Humble  PCP: [name], MD   Patient coming from: Home  Chief Complaint: Hypoxia  HPI: Ryan Miles is a 75 y.o. male with medical history significant of laryngeal cancer status post laryngectomy, coronary artery disease, tracheostomy, history of lung cancer, history of spinal stenosis, hypothyroidism and essential hypertension who presented to the ER with altered mental status and hypoxia.  Patient's daughter is with him.  Apparently over the last couple of days he has progressively getting worse.  He was recently admitted on March 17 here with hypoxia on room air.  He was discharged home and was doing better.  He now has significant hypoxia today.  Has low-grade temperature.  His sats were in the mid 80s.  During last hospitalization he was treated for community-acquired pneumonia with Rocephin and azithromycin.  He was follow with the [place].  He is on 5 L of oxygen at home there is tracheostomy stoma.  He otherwise has no significant distress.  Oxygen has been turned on higher on his oxygen sats are improving.  He is being admitted with worsening pneumonia indicator of healthcare associated pneumonia..  ED Course: Temperature is 100.6 pressure 93/55 pulse 130 respirate 18 oxygen sat currently 93% on 6 L.  White count 11.1 hemoglobin 12.2 and platelets 327.  Chemistry largely within normal except calcium 8.5.  COVID-19 and influenza screen negative.  Lactic acid 1.7.  Chest x-ray showed marked severity of infiltrate left upper lobe and left lower lobe which increase compared to prior studies.  There is moderate sized left pleural effusion.  Patient therefore being admitted to the hospital with healthcare associated pneumonia and probably parapneumonic effusion.  Review of Systems: As per HPI otherwise 10 point review of systems negative.    Past Medical History:  Diagnosis Date   . Coronary artery disease involving native coronary artery 03/2012   -100% RCA occluded-DES x2 [facility]  . 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 Hypertension   . Lung cancer ([place])   . Spinal stenosis   . ST elevation myocardial infarction (STEMI) of inferior wall ([place]) 03/2012   ([place]) 100% RCA-DES PCI x2 to RCA    Past Surgical History:  Procedure Laterality Date  . CORONARY STENT PLACEMENT  03/2012   [facility]: DES x2 to RCA.  Marland Kitchen LEFT HEART CATH AND CORONARY ANGIOGRAPHY  03/2012   ([facility])-inferior STEMI, 100% RCA.  Marland Kitchen LUNG LOBECTOMY Right 02/2015   Right lower lobe ([place])  . TOTAL LARYNGECTOMY    . TRANSTHORACIC ECHOCARDIOGRAM  01/22/2020   EF estimated greater than 55%.  Mild LV thickening.  Normal RV function.  Moderate MR.  Otherwise trivial AI, PRN TR.  Otherwise normal     reports that he has quit smoking. He has never used smokeless tobacco. He reports that he does not drink alcohol and does not use drugs.  Allergies  Allergen Reactions  . Rosuvastatin     Other reaction(s): Headache, Other (See Comments) Entire body aches.  . Atorvastatin     Other reaction(s): Muscle Pain  . Cephalexin     Other reaction(s): Other (See Comments) "fuzzy mouth"  . Penicillins     Other reaction(s): Unknown    Family History  Family history unknown: Yes     Prior to Admission medications   Medication Sig Start Date End Date Taking? Authorizing Provider  aspirin  81 MG EC tablet Take 81 mg by mouth daily. 06/09/12   [provider]  cyanocobalamin 1000 MCG tablet Take 1,000 mcg by mouth daily. 06/20/20   [provider]  DULoxetine (CYMBALTA) 20 MG capsule Take 20 mg by mouth daily. 11/10/20   [provider]  feeding supplement (ENSURE ENLIVE / ENSURE PLUS) LIQD Take 237 mLs by mouth 2 (two) times daily between meals.    [provider]  gabapentin (NEURONTIN) 300 MG capsule Take 300 mg by mouth 4  (four) times daily. 11/10/20   [provider]  levothyroxine (SYNTHROID) 88 MCG tablet Take 88 mcg by mouth daily. 06/20/20   [provider]  methadone (DOLOPHINE) 5 MG tablet Take 5 mg by mouth 4 (four) times daily. 11/28/20   [provider]  senna (SENOKOT) 8.6 MG tablet Take 3-4 tablets by mouth at bedtime.    [provider]    Physical Exam: Vitals:   01/08/21 1715 01/08/21 1800 01/08/21 1821 01/08/21 1915  BP: 122/72 130/77  121/67  Pulse: (!) 119 (!) 131  (!) 127  Resp: 16 18  16   Temp:   (!) 100.6 F (38.1 C)   TempSrc:   Rectal   SpO2: 94% 94%  100%  Weight: 62.1 kg     Height: 5\' 7"  (1.702 m)         Constitutional: Chronically ill looking and cachectic Vitals:   01/08/21 1715 01/08/21 1800 01/08/21 1821 01/08/21 1915  BP: 122/72 130/77  121/67  Pulse: (!) 119 (!) 131  (!) 127  Resp: 16 18  16   Temp:   (!) 100.6 F (38.1 C)   TempSrc:   Rectal   SpO2: 94% 94%  100%  Weight: 62.1 kg     Height: 5\' 7"  (1.702 m)      Eyes: PERRL, lids and conjunctivae normal ENMT: Mucous membranes are dry. Posterior pharynx clear of any exudate or lesions.Normal dentition.  Neck: Tracheostomy, normal stoma with no evidence of obstruction or secretions, normal, supple, no masses, no thyromegaly Respiratory: Coarse breath sound bilaterally with some rhonchi, decreased air entry with some basal crackles. Normal respiratory effort. No accessory muscle use.  Cardiovascular: Sinus tachycardia, no murmurs / rubs / gallops. No extremity edema. 2+ pedal pulses. No carotid bruits.  Abdomen: no tenderness, no masses palpated. No hepatosplenomegaly. Bowel sounds positive.  Musculoskeletal: no clubbing / cyanosis. No joint deformity upper and lower extremities. Good ROM, no contractures. Normal muscle tone.  Skin: no rashes, lesions, ulcers. No induration Neurologic: CN 2-12 grossly intact. Sensation intact, DTR normal. Strength 5/5 in all 4.  Psychiatric:  Normal judgment and insight. Alert and oriented x 3. Normal mood.     Labs on Admission: I have personally reviewed following labs and imaging studies  CBC: Recent Labs  Lab 01/08/21 1624  WBC 11.0*  NEUTROABS 8.1*  HGB 12.2*  HCT 38.7*  MCV 91.1  PLT 831   Basic Metabolic Panel: Recent Labs  Lab 01/08/21 1624  NA 138  K 4.5  CL 102  CO2 29  GLUCOSE 110*  BUN 11  CREATININE 0.89  CALCIUM 8.5*   GFR: Estimated Creatinine Clearance: 62 mL/min (by C-G formula based on SCr of 0.89 mg/dL). Liver Function Tests: Recent Labs  Lab 01/08/21 1624  AST 13*  ALT 9  ALKPHOS 106  BILITOT 0.9  PROT 7.1  ALBUMIN 2.2*   No results for input(s): LIPASE, AMYLASE in the last 168 hours. No results for input(s):  AMMONIA in the last 168 hours. Coagulation Profile: No results for input(s): INR, PROTIME in the last 168 hours. Cardiac Enzymes: No results for input(s): CKTOTAL, CKMB, CKMBINDEX, TROPONINI in the last 168 hours. BNP (last 3 results) No results for input(s): PROBNP in the last 8760 hours. HbA1C: No results for input(s): HGBA1C in the last 72 hours. CBG: No results for input(s): GLUCAP in the last 168 hours. Lipid Profile: No results for input(s): CHOL, HDL, LDLCALC, TRIG, CHOLHDL, LDLDIRECT in the last 72 hours. Thyroid Function Tests: No results for input(s): TSH, T4TOTAL, FREET4, T3FREE, THYROIDAB in the last 72 hours. Anemia Panel: No results for input(s): VITAMINB12, FOLATE, FERRITIN, TIBC, IRON, RETICCTPCT in the last 72 hours. Urine analysis:    Component Value Date/Time   COLORURINE  12/18/2020 0432    QUESTIONABLE IDENTIFICATION / INCORRECTLY LABELED SPECIMEN   APPEARANCEUR  12/18/2020 0432    QUESTIONABLE IDENTIFICATION / INCORRECTLY LABELED SPECIMEN   LABSPEC  12/18/2020 0432    QUESTIONABLE IDENTIFICATION / INCORRECTLY LABELED SPECIMEN   PHURINE  12/18/2020 0432    QUESTIONABLE IDENTIFICATION / INCORRECTLY LABELED SPECIMEN   GLUCOSEU  12/18/2020  0432    QUESTIONABLE IDENTIFICATION / INCORRECTLY LABELED SPECIMEN   HGBUR  12/18/2020 0432    QUESTIONABLE IDENTIFICATION / INCORRECTLY LABELED SPECIMEN   BILIRUBINUR  12/18/2020 0432    QUESTIONABLE IDENTIFICATION / INCORRECTLY LABELED SPECIMEN   KETONESUR  12/18/2020 0432    QUESTIONABLE IDENTIFICATION / INCORRECTLY LABELED SPECIMEN   PROTEINUR  12/18/2020 0432    QUESTIONABLE IDENTIFICATION / INCORRECTLY LABELED SPECIMEN   NITRITE  12/18/2020 0432    QUESTIONABLE IDENTIFICATION / INCORRECTLY LABELED SPECIMEN   LEUKOCYTESUR  12/18/2020 0432    QUESTIONABLE IDENTIFICATION / INCORRECTLY LABELED SPECIMEN   Sepsis Labs: @LABRCNTIP (procalcitonin:4,lacticidven:4) )No results found for this or any previous visit (from the past 240 hour(s)).   Radiological Exams on Admission: DG Chest Portable 1 View  Result Date: 01/08/2021 CLINICAL DATA:  Tachycardia. EXAM: PORTABLE CHEST 1 VIEW COMPARISON:  December 22, 2020 FINDINGS: Marked severity infiltrate is seen throughout the left lower lobe and suprahilar region of the left upper lobe. This is increased in severity when compared to the prior study. Mild left-sided volume loss is noted. Stable mild to moderate severity right basilar atelectasis and/or infiltrate is seen. A moderate sized left-sided pleural effusion is present. This is also increased in size. No pneumothorax is identified. The heart size and mediastinal contours are within normal limits. Marked severity calcification of the thoracic aorta is seen. Radiopaque surgical clips are seen overlying the soft tissues of the neck. The visualized skeletal structures are unremarkable. IMPRESSION: 1. Marked severity infiltrate left upper lobe and left lower lobe infiltrates, increased in severity when compared to the prior study. 2. Stable mild to moderate severity right basilar atelectasis and/or infiltrate. 3. Moderate size left pleural effusion. Electronically Signed   By: Virgina Norfolk M.D.   On:  01/08/2021 16:52     Assessment/Plan Principal Problem:   HCAP (healthcare-associated pneumonia) Active Problems:   Coronary artery disease involving native heart without angina pectoris   Acute hypoxemic respiratory failure ([place])   Laryngeal cancer ([place])   Tracheostomy dependence ([place])     #1 Healthcare associated pneumonia: Patient will be admitted and be treated for HCAP.  Initiate vancomycin, cefepime and Levaquin.  Obtain cultures and monitor.  Continue oxygen in the hospital.  #2 history of laryngeal and lung cancers: Patient has tracheostomy in place.  We will continue oxygenation via his stoma.  All further  follow-up with his oncologist.  #3 acute on chronic hypoxic respiratory failure: Patient on 5 L at home.  We will admit keeping him at all close to his home regimen.  #4 essential hypertension, continue with home regimen  #5 pleural effusion: Moderate size on x-ray.  If no significant improvement may need to evaluate for possible thoracentesis.  With a history of lung cancer this may be playing a role.  #6 coronary artery disease: Stable.  Continue to monitor.   DVT prophylaxis: Lovenox Code Status: Full code Family Communication: Daughter at bedside. Disposition Plan: Home Consults called: None Admission status: Inpatient  Severity of Illness: The appropriate patient status for this patient is INPATIENT. Inpatient status is judged to be reasonable and necessary in order to provide the required intensity of service to ensure the patient's safety. The patient's presenting symptoms, physical exam findings, and initial radiographic and laboratory data in the context of their chronic comorbidities is felt to place them at high risk for further clinical deterioration. Furthermore, it is not anticipated that the patient will be medically stable for discharge from the hospital within 2 midnights of admission. The following factors support the patient status of inpatient.    " The patient's presenting symptoms include shortness of breath. " The worrisome physical exam findings include chronically ill looking with hypoxia. " The initial radiographic and laboratory data are worrisome because of worsening infiltrates on chest x-ray. " The chronic co-morbidities include history of laryngeal cancer.   * I certify that at the point of admission it is my clinical judgment that the patient will require inpatient hospital care spanning beyond 2 midnights from the point of admission due to high intensity of service, high risk for further deterioration and high frequency of surveillance required.Barbette Merino MD [facility] Pager [phone]  If 7PM-7AM, please contact night-coverage [email or web address] Password [facility]  01/08/2021, 7:46 PM

## 2021-01-09 ENCOUNTER — Other Ambulatory Visit: Payer: Self-pay

## 2021-01-09 ENCOUNTER — Inpatient Hospital Stay (HOSPITAL_COMMUNITY): Payer: Medicare Other

## 2021-01-09 ENCOUNTER — Encounter (HOSPITAL_COMMUNITY)
Admission: EM | Disposition: A | Discharge status: Hospice | Payer: Self-pay | Source: Home / Self Care | Attending: Internal Medicine

## 2021-01-09 ENCOUNTER — Encounter (HOSPITAL_COMMUNITY): Payer: Self-pay | Admitting: Internal Medicine

## 2021-01-09 DIAGNOSIS — J189 Pneumonia, unspecified organism: Secondary | ICD-10-CM

## 2021-01-09 DIAGNOSIS — Z930 Tracheostomy status: Secondary | ICD-10-CM

## 2021-01-09 DIAGNOSIS — J9621 Acute and chronic respiratory failure with hypoxia: Secondary | ICD-10-CM

## 2021-01-09 DIAGNOSIS — E43 Unspecified severe protein-calorie malnutrition: Secondary | ICD-10-CM | POA: Insufficient documentation

## 2021-01-09 HISTORY — PX: THORACENTESIS: SHX235

## 2021-01-09 LAB — CBC WITH DIFFERENTIAL/PLATELET
Abs Immature Granulocytes: 0.71 10*3/uL — ABNORMAL HIGH (ref 0.00–0.07)
Basophils Absolute: 0 10*3/uL (ref 0.0–0.1)
Basophils Relative: 0 %
Eosinophils Absolute: 0 10*3/uL (ref 0.0–0.5)
Eosinophils Relative: 0 %
HCT: 31.8 % — ABNORMAL LOW (ref 39.0–52.0)
Hemoglobin: 10.1 g/dL — ABNORMAL LOW (ref 13.0–17.0)
Immature Granulocytes: 6 %
Lymphocytes Relative: 11 %
Lymphs Abs: 1.2 10*3/uL (ref 0.7–4.0)
MCH: 29.1 pg (ref 26.0–34.0)
MCHC: 31.8 g/dL (ref 30.0–36.0)
MCV: 91.6 fL (ref 80.0–100.0)
Monocytes Absolute: 1.2 10*3/uL — ABNORMAL HIGH (ref 0.1–1.0)
Monocytes Relative: 10 %
Neutro Abs: 8.1 10*3/uL — ABNORMAL HIGH (ref 1.7–7.7)
Neutrophils Relative %: 73 %
Platelets: 248 10*3/uL (ref 150–400)
RBC: 3.47 MIL/uL — ABNORMAL LOW (ref 4.22–5.81)
RDW: 17.2 % — ABNORMAL HIGH (ref 11.5–15.5)
WBC: 11.3 10*3/uL — ABNORMAL HIGH (ref 4.0–10.5)
nRBC: 0 % (ref 0.0–0.2)

## 2021-01-09 LAB — BLOOD CULTURE ID PANEL (REFLEXED) - BCID2

## 2021-01-09 LAB — BODY FLUID CELL COUNT WITH DIFFERENTIAL
Eos, Fluid: 0 %
Lymphs, Fluid: 70 %
Monocyte-Macrophage-Serous Fluid: 14 % — ABNORMAL LOW (ref 50–90)
Neutrophil Count, Fluid: 16 % (ref 0–25)
Total Nucleated Cell Count, Fluid: 1270 cu mm — ABNORMAL HIGH (ref 0–1000)

## 2021-01-09 LAB — COMPREHENSIVE METABOLIC PANEL
ALT: 8 U/L (ref 0–44)
AST: 11 U/L — ABNORMAL LOW (ref 15–41)
Albumin: 1.8 g/dL — ABNORMAL LOW (ref 3.5–5.0)
Alkaline Phosphatase: 83 U/L (ref 38–126)
Anion gap: 6 (ref 5–15)
BUN: 11 mg/dL (ref 8–23)
CO2: 28 mmol/L (ref 22–32)
Calcium: 7.7 mg/dL — ABNORMAL LOW (ref 8.9–10.3)
Chloride: 104 mmol/L (ref 98–111)
Creatinine, Ser: 0.83 mg/dL (ref 0.61–1.24)
GFR, Estimated: >60 mL/min (ref >60)
Glucose, Bld: 93 mg/dL (ref 70–99)
Potassium: 4.4 mmol/L (ref 3.5–5.1)
Sodium: 138 mmol/L (ref 135–145)
Total Bilirubin: 0.6 mg/dL (ref 0.3–1.2)
Total Protein: 5.9 g/dL — ABNORMAL LOW (ref 6.5–8.1)

## 2021-01-09 LAB — PROTEIN, PLEURAL OR PERITONEAL FLUID: Total protein, fluid: 3.5 g/dL

## 2021-01-09 LAB — GLUCOSE, PLEURAL OR PERITONEAL FLUID: Glucose, Fluid: 94 mg/dL

## 2021-01-09 LAB — LACTATE DEHYDROGENASE, PLEURAL OR PERITONEAL FLUID: LD, Fluid: 91 U/L — ABNORMAL HIGH (ref 3–23)

## 2021-01-09 LAB — ALBUMIN, PLEURAL OR PERITONEAL FLUID: Albumin, Fluid: 1.3 g/dL

## 2021-01-09 LAB — PROTEIN, TOTAL: Total Protein: 6.1 g/dL — ABNORMAL LOW (ref 6.5–8.1)

## 2021-01-09 LAB — LACTATE DEHYDROGENASE: LDH: 111 U/L (ref 98–192)

## 2021-01-09 LAB — ALBUMIN: Albumin: 1.7 g/dL — ABNORMAL LOW (ref 3.5–5.0)

## 2021-01-09 LAB — HIV ANTIBODY (ROUTINE TESTING W REFLEX): HIV Screen 4th Generation wRfx: NONREACTIVE

## 2021-01-09 SURGERY — THORACENTESIS
Anesthesia: LOCAL

## 2021-01-09 MED ORDER — GABAPENTIN 300 MG PO CAPS
300.0000 mg | ORAL_CAPSULE | Freq: Four times a day (QID) | ORAL | Status: DC
  Administered 2021-01-09 – 2021-01-11 (×10): 300 mg via ORAL
  Filled 2021-01-09 (×11): qty 1

## 2021-01-09 MED ORDER — LEVOTHYROXINE SODIUM 88 MCG PO TABS
88.0000 ug | ORAL_TABLET | Freq: Every day | ORAL | Status: DC
  Administered 2021-01-10 – 2021-01-12 (×3): 88 ug via ORAL
  Filled 2021-01-09 (×3): qty 1

## 2021-01-09 MED ORDER — DULOXETINE HCL 20 MG PO CPEP
20.0000 mg | ORAL_CAPSULE | Freq: Every day | ORAL | Status: DC
  Administered 2021-01-09 – 2021-01-12 (×4): 20 mg via ORAL
  Filled 2021-01-09 (×4): qty 1

## 2021-01-09 MED ORDER — VANCOMYCIN HCL 1250 MG/250ML IV SOLN
1250.0000 mg | INTRAVENOUS | Status: DC
  Administered 2021-01-09 – 2021-01-11 (×3): 1250 mg via INTRAVENOUS
  Filled 2021-01-09 (×5): qty 250

## 2021-01-09 MED ORDER — IPRATROPIUM-ALBUTEROL 0.5-2.5 (3) MG/3ML IN SOLN: 3.0000 mL | RESPIRATORY_TRACT | Status: DC | PRN

## 2021-01-09 MED ORDER — ENSURE ENLIVE PO LIQD
237.0000 mL | Freq: Two times a day (BID) | ORAL | Status: DC
  Administered 2021-01-09 – 2021-01-12 (×4): 237 mL via ORAL
  Filled 2021-01-09: qty 237

## 2021-01-09 MED ORDER — SENNA 8.6 MG PO TABS
3.0000 | ORAL_TABLET | Freq: Every day | ORAL | Status: DC
  Administered 2021-01-09 – 2021-01-11 (×2): 25.8 mg via ORAL
  Filled 2021-01-09 (×2): qty 3

## 2021-01-09 MED ORDER — METHADONE HCL 5 MG PO TABS
5.0000 mg | ORAL_TABLET | Freq: Every day | ORAL | Status: DC
Start: 2021-01-09 — End: 2021-01-12
  Administered 2021-01-09 – 2021-01-12 (×16): 5 mg via ORAL
  Filled 2021-01-09 (×16): qty 1

## 2021-01-09 MED ORDER — SODIUM CHLORIDE 0.9 % IV SOLN
2.0000 g | Freq: Three times a day (TID) | INTRAVENOUS | Status: DC
  Administered 2021-01-09 – 2021-01-12 (×9): 2 g via INTRAVENOUS
  Filled 2021-01-09 (×11): qty 2

## 2021-01-09 MED ORDER — KETOROLAC TROMETHAMINE 15 MG/ML IJ SOLN
15.0000 mg | Freq: Once | INTRAMUSCULAR | Status: AC
  Administered 2021-01-09: 15 mg via INTRAVENOUS
  Filled 2021-01-09: qty 1

## 2021-01-09 MED ORDER — LIDOCAINE 5 % EX PTCH
1.0000 | MEDICATED_PATCH | CUTANEOUS | Status: DC
  Administered 2021-01-09 – 2021-01-11 (×3): 1 via TRANSDERMAL
  Filled 2021-01-09 (×2): qty 1

## 2021-01-09 NOTE — Progress Notes (Signed)
     Referral received for Ryan Miles :goals of care discussion. Chart reviewed and updates received from RN. Patient is off unit for thoracentesis.   Patient is familiar to our service as he was seen during previous admission in March 2022. Please see consult note from colleague, Dr. Rowe Pavy on 12/20/2020.   Daughter at the bedside, however expressed she had to make some important phone calls at this time.   PMT will re-attempt to contact family at a later time/date. Detailed note and recommendations to follow once GOC has been completed.   Thank you for your referral and allowing PMT to assist in Mr. [name] care.   Alda Lea, AGPCNP-BC Palliative Medicine Team   NO CHARGE

## 2021-01-09 NOTE — Progress Notes (Addendum)
Modified Barium Swallow Progress Note  Patient Details  Name: Ryan Miles MRN: [id number] Date of Birth: 06/24/1946  Today's Date: 01/09/2021  Modified Barium Swallow completed.  Full report located under Chart Review in the Imaging Section.  Brief recommendations include the following:  Clinical Impression  MBS was completed with noted anatomical differences s/p total laryngectomy. There was no barium observed to leak at the level of his TEP, therefore with no aspiration observed throughout the study. Pt did have slow clearance of his esophagus at the level of his TEP and more superiorly, but this was facilitated with liquid washes and is felt to be more chronic in nature. Pt does endorse a h/o esophageal dilation but says he has not needed this repeated in the last 1-2 years. Recommend resuming his baseline diet of regular solids and thin liquids (allowing pt to choose sosfter options PRN).    Additionally, SLP also asked pt and his daughter about his stoma care. He has not had his TEP exchanged in about a year, and it was about a year ago that he also stopped wearing his HME/faceplate, primarily because his UE weakness precluded his ability to care for this independently. This is not something that his daughter has been helping with, but they are agreeable to her learning. Recommend f/u for his TEP with his primary SLP through the [place], but would also consider the potential benefit of HMEs vs humidifcation to facilitate pt's secretions. Please consider ordering SLP for laryngectomy care if indicated.    Swallow Evaluation Recommendations       SLP Diet Recommendations: Regular solids;Thin liquid   Liquid Administration via: Cup;Straw   Medication Administration: Whole meds with liquid   Supervision: Staff to assist with self feeding   Compensations: Slow rate;Small sips/bites;Follow solids with liquid   Postural Changes: Seated upright at 90 degrees   Oral Care Recommendations: Oral  care BID        Osie Bond., M.A. [facility] Pager [phone] Office [phone]  01/09/2021,3:25 PM

## 2021-01-09 NOTE — Progress Notes (Signed)
Pharmacy Antibiotic Note  Ryan Miles is a 75 y.o. male admitted on 01/08/2021 with pneumonia. Patient recently hospitalized for pneumonia and completed 7 days of azithromycin and ceftriaxone.  Vancomcyin and Cefepime started 4/7 then was discontinued per MD 4/8 AM.   Pharmacy has been re- consulted for vancomycin dosing.  Received Vancomycin loading dose 1500mg  lon 4/7 PM.  Plan: Cefepime 2g q8h  Vancomycin 1250 mg IV Q 24 hrs. Goal AUC 400-550. Expected AUC: 485  SCr used: 0.83 - Monitor clinical improvement, LOT, and de-escalation  - Obtain vancomycin levels as clinically indicated   Height: 5\' 7"  (170.2 cm) Weight: 56.6 kg (124 lb 12.5 oz) IBW/kg (Calculated) : 66.1  Temp (24hrs), Avg:98.6 F (37 C), Min:97.7 F (36.5 C), Max:100.6 F (38.1 C)  Recent Labs  Lab 01/08/21 1624 01/08/21 1718 01/08/21 1930 01/09/21 0234  WBC 11.0*  --   --  11.3*  CREATININE 0.89  --   --  0.83  LATICACIDVEN  --  1.3 1.7  --     Estimated Creatinine Clearance: 60.6 mL/min (by C-G formula based on SCr of 0.83 mg/dL).    Allergies  Allergen Reactions  . Rosuvastatin     Other reaction(s): Headache, Other (See Comments) Entire body aches.  . Atorvastatin     Other reaction(s): Muscle Pain  . Cephalexin     Other reaction(s): Other (See Comments) "fuzzy mouth"  . Penicillins     Other reaction(s): Unknown    Antimicrobials this admission: Vanc 4/7 >> Cefepime 4/7>> Levaqui 4/7 x1  Microbiology:  4/7 BCx x2: GPC   BCID: Staph epidermidis   4/7 covid 19: negative; flu: neg   Thank you for allowing pharmacy to be a part of this patient's care.   Nicole Cella, RPh Clinical Pharmacist  Please check AMION for all [place] phone numbers After 10:00 PM, call [facility] [phone]  01/09/2021 2:27 PM  Please check [email or web address] for unit-specific pharmacy phone numbers.

## 2021-01-09 NOTE — Progress Notes (Signed)
PHARMACY - PHYSICIAN COMMUNICATION CRITICAL VALUE ALERT - BLOOD CULTURE IDENTIFICATION (BCID)  Ryan Miles is an 75 y.o. male who presented to [facility] on 01/08/2021 with a chief complaint of new delirium and low grade fever.  Assessment:  75 yo male presents with new delirium and low grade fever with concern for pneumonia with new L-sided pleural effusion, ruling out empyema. The pt was recently hospitalized and treated for acute hypoxic respiratory failure. He is now being treated for acute hypoxic respiratory failure in the setting of recurrent pneumonia and suspected aspiration as he has a known history of dysphagia.  BCID with staph epidermidis in 3/4 bottles with mecA detected.  Name of physician (or Provider) Contacted: Dr. Starla Link  Current antibiotics: Vancomycin IV 1250 mg q24h and cefepime IV 2g q8h  Changes to prescribed antibiotics recommended:  Patient is on recommended antibiotics - No changes needed  Results for orders placed or performed during the hospital encounter of 01/08/21  Blood Culture ID Panel (Reflexed) (Collected: 01/08/2021  5:40 PM)  Result Value Ref Range   Enterococcus faecalis NOT DETECTED NOT DETECTED   Enterococcus Faecium NOT DETECTED NOT DETECTED   Listeria monocytogenes NOT DETECTED NOT DETECTED   Staphylococcus species DETECTED (A) NOT DETECTED   Staphylococcus aureus (BCID) NOT DETECTED NOT DETECTED   Staphylococcus epidermidis DETECTED (A) NOT DETECTED   Staphylococcus lugdunensis NOT DETECTED NOT DETECTED   Streptococcus species NOT DETECTED NOT DETECTED   Streptococcus agalactiae NOT DETECTED NOT DETECTED   Streptococcus pneumoniae NOT DETECTED NOT DETECTED   Streptococcus pyogenes NOT DETECTED NOT DETECTED   A.calcoaceticus-baumannii NOT DETECTED NOT DETECTED   Bacteroides fragilis NOT DETECTED NOT DETECTED   Enterobacterales NOT DETECTED NOT DETECTED   Enterobacter cloacae complex NOT DETECTED NOT DETECTED   Escherichia coli NOT DETECTED NOT  DETECTED   Klebsiella aerogenes NOT DETECTED NOT DETECTED   Klebsiella oxytoca NOT DETECTED NOT DETECTED   Klebsiella pneumoniae NOT DETECTED NOT DETECTED   Proteus species NOT DETECTED NOT DETECTED   Salmonella species NOT DETECTED NOT DETECTED   Serratia marcescens NOT DETECTED NOT DETECTED   Haemophilus influenzae NOT DETECTED NOT DETECTED   Neisseria meningitidis NOT DETECTED NOT DETECTED   Pseudomonas aeruginosa NOT DETECTED NOT DETECTED   Stenotrophomonas maltophilia NOT DETECTED NOT DETECTED   Candida albicans NOT DETECTED NOT DETECTED   Candida auris NOT DETECTED NOT DETECTED   Candida glabrata NOT DETECTED NOT DETECTED   Candida krusei NOT DETECTED NOT DETECTED   Candida parapsilosis NOT DETECTED NOT DETECTED   Candida tropicalis NOT DETECTED NOT DETECTED   Cryptococcus neoformans/gattii NOT DETECTED NOT DETECTED   Methicillin resistance mecA/C DETECTED (A) NOT DETECTED    Shauna Hugh, PharmD, [place]  PGY-1 Pharmacy Resident 01/09/2021 2:11 PM  Please check [email or web address] for unit-specific pharmacy phone numbers.

## 2021-01-09 NOTE — Procedures (Addendum)
Thoracentesis  Procedure Note  Ryan Miles  [id number]  October 09, 1945  Date:01/09/21  Time:2:40 PM   Provider Performing:Timothy Chauncey Cruel Dierdre Highman  Procedure: Thoracentesis with imaging guidance [phone])  Indication(s) Pleural Effusion  Consent Risks of the procedure as well as the alternatives and risks of each were explained to the patient and/or caregiver.  Consent for the procedure was obtained and is signed in the bedside chart  Anesthesia Topical only with 1% lidocaine    Time Out Verified patient identification, verified procedure, site/side was marked, verified correct patient position, special equipment/implants available, medications/allergies/relevant history reviewed, required imaging and test results available.   Sterile Technique Maximal sterile technique including full sterile barrier drape, hand hygiene, sterile gown, sterile gloves, mask, hair covering, sterile ultrasound probe cover (if used).  Procedure Description Ultrasound was used to identify appropriate pleural anatomy for placement and overlying skin marked.  Area of drainage cleaned and draped in sterile fashion. Lidocaine was used to anesthetize the skin and subcutaneous tissue.  650cc's of concentrated orange appearing fluid was drained from the left pleural space. Catheter then removed and bandaid applied to site.   Complications/Tolerance None; patient tolerated the procedure well. Chest X-ray is ordered to confirm no post-procedural complication.   EBL Minimal   Specimen(s) Pleural fluid  Ryan Miles., MSN, APRN, AGACNP-BC [facility]  01/09/2021 , 2:42 PM    I independently supervised Mr Ryan Miles ACNP and assisted with needle placement as well as advancement   Erick Colace ACNP-BC [place] Pager # [phone] OR # [phone] if no answer

## 2021-01-09 NOTE — Plan of Care (Signed)

## 2021-01-09 NOTE — Progress Notes (Signed)
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  GOAL:   Patient will meet greater than or equal to 90% of their needs  MONITOR:   PO intake,Supplement acceptance,Labs,Weight trends,Skin  REASON FOR ASSESSMENT:   Consult Assessment of nutrition requirement/status  ASSESSMENT:   75 year old male who presented to the ED on 4/07 with hypoxia and AMS. PMH of laryngeal cancer s/p laryngectomy, lung cancer, CAD, tracheostomy, spinal stenosis, hypothyroidism, HTN. Admitted with HCAP, pleural effusion.   4/08 - MBS, diet advanced to regular with thin liquids  Noted plan for left-sided thoracentesis today.  Spoke with pt and daughter at bedside. Pt's daughter reports poor PO intake (just bites at meals) for the last 3 weeks. She reports that the last time pt had a real meal was "weeks" ago. Pt's daughter states that pt consumes supplements at home but usually only one (or less than one) per day. Prior to this, pt would either eat really well (3 meals a day) or not eat much at all. His appetite would go up and down. Pt's daughter reports that pt feeds himself at home. Pt states that he consumes softer foods.  Spoke with pt who reports that his UBW prior to any weight loss was 145 lbs. He is unsure when he last weighed this. Reviewed weight history in chart. Pt with a 5.5 kg weight loss since 12/18/20. This is an 8.9% weight loss in less than 1 month which is severe and significant for  timeframe.  Pt denies any nausea or vomiting. His daughter states that pt fluctuates between constipation and diarrhea.  He is willing to consume supplements and does enjoy ice cream. RD will order Magic Cups with meals.  Discussed ways to increase kcal and protein with pt's daughter. Pt's daughter reports that she adds protein powder to pt's foods but that he doesn't really eat enough of the food to make a difference.  Noted Palliative consult pending.  Medications reviewed and include: methadone, senna IVF: NS @ 75 ml/hr  Labs reviewed.  NUTRITION - FOCUSED PHYSICAL EXAM:  Flowsheet Row Most Recent Value  Orbital Region Severe depletion  Upper Arm Region Moderate depletion  Thoracic and Lumbar Region Severe depletion  Buccal Region Severe depletion  Temple Region Severe depletion  Clavicle Bone Region Severe depletion  Clavicle and Acromion Bone Region Severe depletion  Scapular Bone Region Unable to assess  Dorsal Hand Severe depletion  Patellar Region Severe depletion  Anterior Thigh Region Severe depletion  Posterior Calf Region Moderate depletion  Edema (RD Assessment) None  Hair Reviewed  Eyes Reviewed  Mouth Reviewed  Skin Reviewed  [dry]  Nails Reviewed       Diet Order:   Diet Order            Diet regular Room service appropriate? Yes; Fluid consistency: Thin  Diet effective now                 EDUCATION NEEDS:   Education needs have been addressed  Skin:  Skin Assessment: Skin  Integrity Issues: Stage II: sacrum  Last BM:  01/09/21  Height:   Ht Readings from Last 1 Encounters:  01/08/21 5\' 7"  (1.702 m)    Weight:   Wt Readings from Last 1 Encounters:  01/08/21 56.6 kg    BMI:  Body mass index is 19.54 kg/m.  Estimated Nutritional Needs:   Kcal:  1650-1850  Protein:  80-90 grams  Fluid:  1.7-1.9 L    Gustavus Bryant, MS, RD, LDN Inpatient Clinical Dietitian Please see AMiON for contact information.

## 2021-01-09 NOTE — Evaluation (Signed)
Clinical/Bedside Swallow Evaluation Patient Details  Name: Ryan Miles MRN: [id number] Date of Birth: 08-21-1946  Today's Date: 01/09/2021 Time: SLP Start Time (ACUTE ONLY): 1046 SLP Stop Time (ACUTE ONLY): 1117 SLP Time Calculation (min) (ACUTE ONLY): 31 min  Past Medical History:  Past Medical History:  Diagnosis Date  . Coronary artery disease involving native coronary artery 03/2012   -100% RCA occluded-DES x2 [facility])  . 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 Hypertension   . Lung cancer ([place])   . Spinal stenosis   . ST elevation myocardial infarction (STEMI) of inferior wall ([place]) 03/2012   ([place]) 100% RCA-DES PCI x2 to RCA   Past Surgical History:  Past Surgical History:  Procedure Laterality Date  . CORONARY STENT PLACEMENT  03/2012   [facility]: DES x2 to RCA.  Marland Kitchen LEFT HEART CATH AND CORONARY ANGIOGRAPHY  03/2012   ([facility])-inferior STEMI, 100% RCA.  Marland Kitchen LUNG LOBECTOMY Right 02/2015   Right lower lobe ([place])  . TOTAL LARYNGECTOMY    . TRANSTHORACIC ECHOCARDIOGRAM  01/22/2020   EF estimated greater than 55%.  Mild LV thickening.  Normal RV function.  Moderate MR.  Otherwise trivial AI, PRN TR.  Otherwise normal   HPI:  Pt is a 75 yo male with a h/o laryngectomy with TEP, presenting with hypoxia, admitted with sepsis. CXR concerning for PNA. Pt had just made the decision to transition to hospice care PTA but did not have a care team established yet. PMH includes: laryngeal and lung cancer, CAD, spinal stenosis, hypothyroidism, HTN, STEMI   Assessment / Plan / Recommendation Clinical Impression  Pt's oral motor exam is [facility] and other than audible swallows, he does not show overt signs of an oropharyngeal dysphagia. He does however have a TEP, which appears to be quite crusted over, particularly in its center. Pt shares that with his UE weakness lately, he has not been able to clean his stoma or provide occlusion in order to attempt  esophageal phonation. As a result he cannot confirm his vocal quality, but he does deny noticing any leakage either through or around his stoma. No overt leaking is noted as SLP provided sips of thin liquids, although a small amount cannot be excluded when taking larger sips. In discussion with pulmonology, aspiration remains high on the differential for possible etiology of current respiratory status with recurrent/worsening PNA, as is mucus plugging. In a pt with a total laryngectomy, risk of aspiration primarily can be attributed to either some sort of fistula or a potential leak. Given the above, recommend proceeding with MBS to provide a more thorough evaluation. In discussion with pt and his daughter, they are both agreeable to proceed. Will plan to complete this afternoon, with MBS tentatively scheduled for 12:30.  SLP Visit Diagnosis: Dysphagia, unspecified (R13.10)    Aspiration Risk  Mild aspiration risk;Moderate aspiration risk    Diet Recommendation Other (Comment) (small sips of water pending MBS)   Liquid Administration via: Cup Medication Administration: Crushed with puree    Other  Recommendations Oral Care Recommendations: Oral care QID   Follow up Recommendations Other (comment) (tba)      Frequency and Duration            Prognosis Prognosis for Safe Diet Advancement: Good      Swallow Study   General HPI: Pt is a 75 yo male with a h/o laryngectomy with TEP, presenting with hypoxia, admitted with sepsis. CXR concerning for PNA.  Pt had just made the decision to transition to hospice care PTA but did not have a care team established yet. PMH includes: laryngeal and lung cancer, CAD, spinal stenosis, hypothyroidism, HTN, STEMI Type of Study: Bedside Swallow Evaluation Previous Swallow Assessment: bSE 3/18 @ [place]; no other utside notes but pt/dtr say he's had many Diet Prior to this Study: NPO Temperature Spikes Noted: Yes (100.6) Respiratory Status: Trach  Collar History of Recent Intubation: No Behavior/Cognition: Alert;Cooperative;Pleasant mood Oral Cavity Assessment: Within Functional Limits Oral Care Completed by SLP: No Oral Cavity - Dentition: Edentulous Self-Feeding Abilities: Total assist Patient Positioning: Upright in bed Baseline Vocal Quality: Aphonic Volitional Swallow: Able to elicit    Oral/Motor/Sensory Function Overall Oral Motor/Sensory Function: Within functional limits   Ice Chips Ice chips: Not tested   Thin Liquid Thin Liquid: Within functional limits Presentation: Cup    Nectar Thick Nectar Thick Liquid: Not tested   Honey Thick Honey Thick Liquid: Not tested   Puree Puree: Not tested   Solid     Solid: Not tested       Osie Bond., M.A. [place] Pager [phone] Office [phone]  01/09/2021,11:27 AM

## 2021-01-09 NOTE — Consult Note (Addendum)
NAME:  Ryan Miles, MRN:  [id number], DOB:  01-Sep-1946, LOS: 1 ADMISSION DATE:  01/08/2021, CONSULTATION DATE:  4/8 REFERRING MD:  Starla Link, CHIEF COMPLAINT:  Pleural effusion   History of Present Illness:  75 year old male patient who presented to the emergency room from home on 4/7 with new delirium, and what was reported as 2-day history of progressive increased work of breathing with hypoxia.  Noted to have low-grade fever.  Home pulse oximetry in the 80s on 5 L via trach collar.  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  Recently hospitalized From March 17 through March 24 for which he was treated for acute hypoxic respiratory failure in the context of community-acquired pneumonia no organism specified, that course was complicated by delirium, AKI, and frequent sinus dysrhythmia.  Of note he also had swallowing evaluation raising concern for aspiration, with significant dysphagia patient refused PEG Significant Hospital Events: Including procedures, antibiotic start and stop dates in addition to other pertinent events   . 4/7 admitted w/ working dx of PNA and left pleural effusion. BC sent. U strep sent. vanc and cefepime started.  . 4/8 pulm consulted for left pleural effusion and hypoxia   Interim History / Subjective:  Currently denies distress, feels comfortable.  No pain Objective   Blood pressure (Abnormal) 109/57, pulse 100, temperature 98.4 F (36.9 C), temperature source Oral, resp. rate 18, height 5\' 7"  (1.702 m), weight 56.6 kg, SpO2 97 %.        Intake/Output Summary (Last 24 hours) at 01/09/2021 1007 Last data filed at 01/09/2021 2440 Gross per 24 hour   Intake 1005.44 ml  Output 600 ml  Net 405.44 ml   Filed Weights   01/08/21 1715 01/08/21 2049  Weight: 62.1 kg 56.6 kg    Examination: General: This is a elderly 75 year old male he is quite weak and debilitated he is in no acute distress on aerosol trach collar HENT: Does exhibit some temporal wasting, mucous membranes moist pupils nonicteric he has a laryngectomy stoma this is unremarkable Lungs: He is diminished on the left, there are audible rales posteriorly on the right, he is slightly tachypneic but no accessory use currently on humidified 2 L via aerosol trach collar Cardiovascular: Tachycardic with heart rate in the 10U holosystolic murmur appreciated Abdomen: Soft not tender no organomegaly Extremities: Warm dry brisk capillary refill Neuro: Awake oriented no focal deficits but does have generalized weakness GU: Voids spontaneously  Labs/imaging that I havepersonally reviewed  (right click and "Reselect all SmartList Selections" daily)  His portable chest x-ray was personally reviewed this demonstrated a significant change from prior imaging on March 21 showing fairly significant left-sided airspace disease involving both the left upper and lower lobe, new left-sided pleural effusion of at least moderate size and some right sided airspace disease  Resolved Hospital Problem list    Assessment & Plan:  Acute hypoxic respiratory failure in the setting of recurrent pneumonia.  Suspect aspiration given known history of dysphagia Left pleural effusion, suspect parapneumonic but need to rule out empyema History of laryngeal cancer status post laryngectomy Chronic dysphagia SIRS/sepsis secondary to pneumonia Severe deconditioning Leukocytosis History of hypothyroidism History of hypertension History of coronary artery disease  Pulmonary consultation problem  list Acute hypoxic respiratory failure in the setting of recurrent pneumonia.  Favor aspiration  pneumonia +/-  recurrent mucous plugging however hcap also a consideration given recent hospitalization complicated further by left-sided pleural effusion  Plan Cont abx day 2 vanc and cefepime Cont ATC, he needs either this at home or a laritube and HME Wean oxygen for sats > 90% Agree swallow eval would be helpful. Have spoken w/ SLP we can also give additional aspiration precautions if he will agree Will bring him to endoscopy today. Plan for left sided thoracentesis to evaluate pleural fluid and assist w/ work of breathing.   Best practice (right click and "Reselect all SmartList Selections" daily)  Per primary   Labs   CBC: Recent Labs  Lab 01/08/21 1624 01/09/21 0234  WBC 11.0* 11.3*  NEUTROABS 8.1* 8.1*  HGB 12.2* 10.1*  HCT 38.7* 31.8*  MCV 91.1 91.6  PLT 327 025    Basic Metabolic Panel: Recent Labs  Lab 01/08/21 1624 01/09/21 0234  NA 138 138  K 4.5 4.4  CL 102 104  CO2 29 28  GLUCOSE 110* 93  BUN 11 11  CREATININE 0.89 0.83  CALCIUM 8.5* 7.7*   GFR: Estimated Creatinine Clearance: 60.6 mL/min (by C-G formula based on SCr of 0.83 mg/dL). Recent Labs  Lab 01/08/21 1624 01/08/21 1718 01/08/21 1930 01/09/21 0234  WBC 11.0*  --   --  11.3*  LATICACIDVEN  --  1.3 1.7  --     Liver Function Tests: Recent Labs  Lab 01/08/21 1624 01/09/21 0234  AST 13* 11*  ALT 9 8  ALKPHOS 106 83  BILITOT 0.9 0.6  PROT 7.1 5.9*  ALBUMIN 2.2* 1.8*   No results for input(s): LIPASE, AMYLASE in the last 168 hours. No results for input(s): AMMONIA in the last 168 hours.  ABG    Component Value Date/Time   PHART 7.436 12/22/2020 1416   PCO2ART 40.7 12/22/2020 1416   PO2ART 56.5 (L) 12/22/2020 1416   HCO3 26.9 12/22/2020 1416   O2SAT 89.7 12/22/2020 1416     Coagulation Profile: No results for input(s): INR, PROTIME in the last 168 hours.  Cardiac Enzymes: No results for input(s): CKTOTAL, CKMB, CKMBINDEX, TROPONINI in the last 168 hours.  HbA1C: No results found for:  HGBA1C  CBG: No results for input(s): GLUCAP in the last 168 hours.  Review of Systems:   Review of Systems  Constitutional: Positive for fever and malaise/fatigue.  HENT: Negative.   Eyes: Negative.   Respiratory: Positive for cough and shortness of breath.   Cardiovascular: Negative.   Gastrointestinal: Negative.   Genitourinary: Negative.   Musculoskeletal: Negative.   Skin: Negative.   Neurological: Negative.   Endo/Heme/Allergies: Negative.   Psychiatric/Behavioral: Negative.      Past Medical History:  He,  has a past medical history of Coronary artery disease involving native coronary artery (03/2012), History of laryngeal cancer, Hypertension, Lung cancer ([place]), Spinal stenosis, and ST elevation myocardial infarction (STEMI) of inferior wall ([place]) (03/2012).   Surgical History:   Past Surgical History:  Procedure Laterality Date  . CORONARY STENT PLACEMENT  03/2012   [facility]: DES x2 to RCA.  Marland Kitchen LEFT HEART CATH AND CORONARY ANGIOGRAPHY  03/2012   ([facility])-inferior STEMI, 100% RCA.  Marland Kitchen LUNG LOBECTOMY Right 02/2015   Right lower lobe ([place])  . TOTAL LARYNGECTOMY    . TRANSTHORACIC ECHOCARDIOGRAM  01/22/2020   EF estimated greater than 55%.  Mild LV thickening.  Normal RV function.  Moderate MR.  Otherwise trivial AI, PRN TR.  Otherwise normal     Social History:   reports that he has quit smoking. He has never used smokeless tobacco. He reports that he does not drink alcohol and does not use drugs.   Family History:  His Family history is unknown by patient.   Allergies Allergies  Allergen Reactions  . Rosuvastatin     Other reaction(s): Headache, Other (See Comments) Entire body aches.  . Atorvastatin     Other reaction(s): Muscle Pain  . Cephalexin     Other reaction(s): Other (See Comments) "fuzzy mouth"  . Penicillins     Other reaction(s): Unknown     Home Medications  Prior to Admission medications   Medication Sig Start Date End Date Taking?  Authorizing Provider  acetaminophen (TYLENOL) 500 MG tablet Take 1,000 mg by mouth every 6 (six) hours as needed for mild pain.   Yes [provider]  aspirin 81 MG EC tablet Take 81 mg by mouth daily. 06/09/12  Yes [provider]  cyanocobalamin 1000 MCG tablet Take 1,000 mcg by mouth daily. 06/20/20  Yes [provider]  DULoxetine (CYMBALTA) 20 MG capsule Take 20 mg by mouth daily. 11/10/20  Yes [provider]  feeding supplement (ENSURE ENLIVE / ENSURE PLUS) LIQD Take 237 mLs by mouth 2 (two) times daily between meals.   Yes [provider]  gabapentin (NEURONTIN) 300 MG capsule Take 300 mg by mouth 4 (four) times daily. 11/10/20  Yes [provider]  levothyroxine (SYNTHROID) 88 MCG tablet Take 88 mcg by mouth daily. 06/20/20  Yes [provider]  methadone (DOLOPHINE) 5 MG tablet Take 5 mg by mouth 5 (five) times daily. 11/28/20  Yes [provider]  senna (SENOKOT) 8.6 MG tablet Take 3-4 tablets by mouth at bedtime.   Yes [provider]     40 minutes > 50% of my time was spend talking to this patient, his daughter and also nursing staff      Erick Colace ACNP-BC [place] Pager # [phone] OR # [phone] if no answer

## 2021-01-09 NOTE — Progress Notes (Signed)
Patient ID: Ryan Miles, male   DOB: 06-24-1946, 75 y.o.   MRN: [id number]  PROGRESS NOTE    Ryan Miles  TOI:[id number] DOB: 18-Apr-1946 DOA: 01/08/2021 PCP: [name], MD   Brief Narrative:  75 y.o. male with medical history significant of laryngeal cancer status post laryngectomy, coronary artery disease, chronic hypoxic respiratory failure on 5 L oxygen via tracheostomy; history of tracheostomy, history of lung cancer, history of spinal stenosis, hypothyroidism and essential hypertension, recent hospitalization in March 2022 for altered mental status secondary to pneumonia treated with antibiotics presented with altered mental status and hypoxia.  On presentation, he had temperature of 100.6, tachycardic to 130s and saturating 93% on 6 L oxygen.  White count was 11.1.  COVID-19 and influenza panels were negative.  Chest x-ray showed mild severity of infiltrate in left upper lobe and left lower lobe with moderate sized left pleural effusion.  Assessment & Plan:   Left-sided multilobar pneumonia with concern for gram-negative rod/MRSA pneumonia Moderate left pleural effusion Acute on chronic hypoxic respiratory failure in a patient with history of laryngeal and lung cancer status post tracheostomy -Continue broad-spectrum antibiotics.  Follow cultures. -Consulted pulmonary.  Will follow recommendations: Might need thoracentesis -Has been on 5 L oxygen via trach recently.  Continue oxygen supplementation via trach.  If respiratory status does not improve, might need CT of the chest. -Outpatient follow-up with his regular oncologist  Acute metabolic encephalopathy -Probably from hypoxia and pneumonia.  Mental status improving.  Monitor  Chronic pain Generalized deconditioning Failure to thrive/decreased appetite Hypoalbuminemia -Patient is on methadone as an outpatient and daughter states that the dose has been changed increased recently by PCP.  Resume methadone and gabapentin.   Watch for signs of respiratory depression.  Explained to the patient and daughter my concerns regarding use of higher doses of pain medications because of respiratory depression. -Appetite and weight has been gradually decreasing recently as per the daughter. -Palliative care consultation for goals of care discussion. -Dietary consultation  Leukocytosis -Mild.  Monitor  Anemia of chronic disease -Probably from his chronic illnesses.  Monitor intermittently  Essential hypertension -Blood pressure on the lower side.  Monitor  Hypothyroidism -Continue levothyroxine  Constipation -Continue senna.  Offered other laxatives as well if needed: Patient hesitant at this time.    DVT prophylaxis: Lovenox Code Status: Wants intubation but no CPR Family Communication: Daughter at bedside Disposition Plan: Status is: Inpatient  Remains inpatient appropriate because:Inpatient level of care appropriate due to severity of illness   Dispo: The patient is from: Home              Anticipated d/c is to: Home              Patient currently is not medically stable to d/c.   Difficult to place patient No  Consultants: Pulmonary/palliative care  Procedures: None  Antimicrobials:  Anti-infectives (From admission, onward)   Start     Dose/Rate Route Frequency Ordered Stop   01/09/21 2000  vancomycin (VANCOREADY) IVPB 1250 mg/250 mL  Status:  Discontinued        1,250 mg 166.7 mL/hr over 90 Minutes Intravenous Every 24 hours 01/08/21 2023 01/09/21 0914   01/09/21 0800  ceFEPIme (MAXIPIME) 2 g in sodium chloride 0.9 % 100 mL IVPB  Status:  Discontinued        2 g 200 mL/hr over 30 Minutes Intravenous Every 12 hours 01/08/21 2022 01/09/21 0914   01/08/21 2000  levofloxacin (LEVAQUIN) IVPB 750 mg  Status:  Discontinued        750 mg 100 mL/hr over 90 Minutes Intravenous Every 24 hours 01/08/21 1946 01/09/21 0914   01/08/21 1745  ceFEPIme (MAXIPIME) 2 g in sodium chloride 0.9 % 100 mL IVPB         2 g 200 mL/hr over 30 Minutes Intravenous  Once 01/08/21 1733 01/08/21 1927   01/08/21 1745  vancomycin (VANCOREADY) IVPB 1500 mg/300 mL        1,500 mg 150 mL/hr over 120 Minutes Intravenous  Once 01/08/21 1734 01/08/21 2143   01/08/21 1730  aztreonam (AZACTAM) 2 g in sodium chloride 0.9 % 100 mL IVPB  Status:  Discontinued        2 g 200 mL/hr over 30 Minutes Intravenous  Once 01/08/21 1727 01/08/21 1733       Subjective: Patient seen and examined at bedside.  Daughter present at bedside.  Complains of pain all over and wants his pain medications.  Complains of shortness of breath and cough.  Poor historian.  No current nausea, vomiting or chest pain reported.  Objective: Vitals:   01/08/21 2024 01/08/21 2025 01/08/21 2049 01/09/21 0813  BP: 109/64  (!) 93/55 (!) 109/57  Pulse: (!) 126 (!) 125 (!) 125 100  Resp: 16 13 17 18   Temp: 98.5 F (36.9 C)  97.7 F (36.5 C) 98.4 F (36.9 C)  TempSrc: Oral  Oral Oral  SpO2: 94% 93% 100% 97%  Weight:   56.6 kg   Height:   5\' 7"  (1.702 m)     Intake/Output Summary (Last 24 hours) at 01/09/2021 1048 Last data filed at 01/09/2021 0956 Gross per 24 hour  Intake 1005.44 ml  Output 600 ml  Net 405.44 ml   Filed Weights   01/08/21 1715 01/08/21 2049  Weight: 62.1 kg 56.6 kg    Examination:  General exam: Chronically ill looking and very deconditioned and extremely thinly built elderly male lying in bed.  On 2 L oxygen via tracheostomy currently ENT: Tracheostomy present Respiratory system: Bilateral decreased breath sounds at bases with scattered crackles Cardiovascular system: S1 & S2 heard, tachycardic Gastrointestinal system: Abdomen is nondistended, soft and nontender. Normal bowel sounds heard. Extremities: No cyanosis, clubbing, edema  Central nervous system: Awake, poor historian. No focal neurological deficits. Moving extremities Skin: No obvious ecchymosis/lesions Psychiatry: Intermittently anxious looking    Data  Reviewed: I have personally reviewed following labs and imaging studies  CBC: Recent Labs  Lab 01/08/21 1624 01/09/21 0234  WBC 11.0* 11.3*  NEUTROABS 8.1* 8.1*  HGB 12.2* 10.1*  HCT 38.7* 31.8*  MCV 91.1 91.6  PLT 327 998   Basic Metabolic Panel: Recent Labs  Lab 01/08/21 1624 01/09/21 0234  NA 138 138  K 4.5 4.4  CL 102 104  CO2 29 28  GLUCOSE 110* 93  BUN 11 11  CREATININE 0.89 0.83  CALCIUM 8.5* 7.7*   GFR: Estimated Creatinine Clearance: 60.6 mL/min (by C-G formula based on SCr of 0.83 mg/dL). Liver Function Tests: Recent Labs  Lab 01/08/21 1624 01/09/21 0234  AST 13* 11*  ALT 9 8  ALKPHOS 106 83  BILITOT 0.9 0.6  PROT 7.1 5.9*  ALBUMIN 2.2* 1.8*   No results for input(s): LIPASE, AMYLASE in the last 168 hours. No results for input(s): AMMONIA in the last 168 hours. Coagulation Profile: No results for input(s): INR, PROTIME in the last 168 hours. Cardiac Enzymes: No results for input(s): CKTOTAL, CKMB, CKMBINDEX, TROPONINI in the last 168  hours. BNP (last 3 results) No results for input(s): PROBNP in the last 8760 hours. HbA1C: No results for input(s): HGBA1C in the last 72 hours. CBG: No results for input(s): GLUCAP in the last 168 hours. Lipid Profile: No results for input(s): CHOL, HDL, LDLCALC, TRIG, CHOLHDL, LDLDIRECT in the last 72 hours. Thyroid Function Tests: No results for input(s): TSH, T4TOTAL, FREET4, T3FREE, THYROIDAB in the last 72 hours. Anemia Panel: No results for input(s): VITAMINB12, FOLATE, FERRITIN, TIBC, IRON, RETICCTPCT in the last 72 hours. Sepsis Labs: Recent Labs  Lab 01/08/21 1718 01/08/21 1930  LATICACIDVEN 1.3 1.7    Recent Results (from the past 240 hour(s))  Blood culture (routine x 2)     Status: None (Preliminary result)   Collection Time: 01/08/21  5:40 PM   Specimen: BLOOD LEFT ARM  Result Value Ref Range Status   Specimen Description BLOOD LEFT ARM  Final   Special Requests   Final    BOTTLES DRAWN  AEROBIC AND ANAEROBIC Blood Culture adequate volume   Culture   Final    NO GROWTH < 24 HOURS Performed at [facility], [place] [address], [place], [place] [address]    Report Status PENDING  Incomplete  Blood culture (routine x 2)     Status: None (Preliminary result)   Collection Time: 01/08/21  5:40 PM   Specimen: BLOOD  Result Value Ref Range Status   Specimen Description BLOOD LEFT ANTECUBITAL  Final   Special Requests   Final    BOTTLES DRAWN AEROBIC AND ANAEROBIC Blood Culture adequate volume   Culture   Final    NO GROWTH < 24 HOURS Performed at [facility], [place] [address], [place],  [address]    Report Status PENDING  Incomplete  Resp Panel by RT-PCR (Flu A&B, Covid) Nasopharyngeal Swab     Status: None   Collection Time: 01/08/21  7:55 PM   Specimen: Nasopharyngeal Swab; Nasopharyngeal(NP) swabs in vial transport medium  Result Value Ref Range Status   SARS Coronavirus 2 by RT PCR NEGATIVE NEGATIVE Final    Comment: (NOTE) SARS-CoV-2 target nucleic acids are NOT DETECTED.  The SARS-CoV-2 RNA is generally detectable in upper respiratory specimens during the acute phase of infection. The lowest concentration of SARS-CoV-2 viral copies this assay can detect is 138 copies/mL. A negative result does not preclude SARS-Cov-2 infection and should not be used as the sole basis for treatment or other patient management decisions. A negative result may occur with  improper specimen collection/handling, submission of specimen other than nasopharyngeal swab, presence of viral mutation(s) within the areas targeted by this assay, and inadequate number of viral copies(<138 copies/mL). A negative result must be combined with clinical observations, patient history, and epidemiological information. The expected result is Negative.  Fact Sheet for Patients:  EntrepreneurPulse.com.au  Fact Sheet for Healthcare Providers:   IncredibleEmployment.be  This test is no t yet approved or cleared by the [place] FDA and  has been authorized for detection and/or diagnosis of SARS-CoV-2 by FDA under an Emergency Use Authorization (EUA). This EUA will remain  in effect (meaning this test can be used) for the duration of the COVID-19 declaration under Section 564(b)(1) of the Act, 21 U.S.C.section 360bbb-3(b)(1), unless the authorization is terminated  or revoked sooner.       Influenza A by PCR NEGATIVE NEGATIVE Final   Influenza B by PCR NEGATIVE NEGATIVE Final    Comment: (NOTE) The Xpert Xpress SARS-CoV-2/FLU/RSV plus assay is  intended as an aid in the diagnosis of influenza from Nasopharyngeal swab specimens and should not be used as a sole basis for treatment. Nasal washings and aspirates are unacceptable for Xpert Xpress SARS-CoV-2/FLU/RSV testing.  Fact Sheet for Patients: EntrepreneurPulse.com.au  Fact Sheet for Healthcare Providers: IncredibleEmployment.be  This test is not yet approved or cleared by the [place] FDA and has been authorized for detection and/or diagnosis of SARS-CoV-2 by FDA under an Emergency Use Authorization (EUA). This EUA will remain in effect (meaning this test can be used) for the duration of the COVID-19 declaration under Section 564(b)(1) of the Act, 21 U.S.C. section 360bbb-3(b)(1), unless the authorization is terminated or revoked.  Performed at [facility], [place] [address], [place], [place] [address]          Radiology Studies: DG Chest Portable 1 View  Result Date: 01/08/2021 CLINICAL DATA:  Tachycardia. EXAM: PORTABLE CHEST 1 VIEW COMPARISON:  December 22, 2020 FINDINGS: Marked severity infiltrate is seen throughout the left lower lobe and suprahilar region of the left upper lobe. This is increased in severity when compared to the prior study. Mild left-sided volume loss is noted. Stable mild  to moderate severity right basilar atelectasis and/or infiltrate is seen. A moderate sized left-sided pleural effusion is present. This is also increased in size. No pneumothorax is identified. The heart size and mediastinal contours are within normal limits. Marked severity calcification of the thoracic aorta is seen. Radiopaque surgical clips are seen overlying the soft tissues of the neck. The visualized skeletal structures are unremarkable. IMPRESSION: 1. Marked severity infiltrate left upper lobe and left lower lobe infiltrates, increased in severity when compared to the prior study. 2. Stable mild to moderate severity right basilar atelectasis and/or infiltrate. 3. Moderate size left pleural effusion. Electronically Signed   By: Virgina Norfolk M.D.   On: 01/08/2021 16:52        Scheduled Meds: . DULoxetine  20 mg Oral Daily  . enoxaparin (LOVENOX) injection  40 mg Subcutaneous Q24H  . gabapentin  300 mg Oral QID  . [START ON 01/10/2021] levothyroxine  88 mcg Oral Q0600  . methadone  5 mg Oral 5 X Daily  . senna  3-4 tablet Oral QHS   Continuous Infusions: . sodium chloride 100 mL/hr at 01/09/21 1034          Ryan Rathgeber Starla Link, MD [facility] 01/09/2021, 10:48 AM

## 2021-01-10 ENCOUNTER — Inpatient Hospital Stay (HOSPITAL_COMMUNITY): Payer: Medicare Other

## 2021-01-10 DIAGNOSIS — Z515 Encounter for palliative care: Secondary | ICD-10-CM

## 2021-01-10 DIAGNOSIS — E43 Unspecified severe protein-calorie malnutrition: Secondary | ICD-10-CM

## 2021-01-10 DIAGNOSIS — Z7189 Other specified counseling: Secondary | ICD-10-CM

## 2021-01-10 DIAGNOSIS — J189 Pneumonia, unspecified organism: Secondary | ICD-10-CM | POA: Diagnosis not present

## 2021-01-10 DIAGNOSIS — Z66 Do not resuscitate: Secondary | ICD-10-CM

## 2021-01-10 DIAGNOSIS — C329 Malignant neoplasm of larynx, unspecified: Secondary | ICD-10-CM

## 2021-01-10 LAB — BASIC METABOLIC PANEL
Anion gap: 4 — ABNORMAL LOW (ref 5–15)
BUN: 11 mg/dL (ref 8–23)
CO2: 27 mmol/L (ref 22–32)
Calcium: 7.6 mg/dL — ABNORMAL LOW (ref 8.9–10.3)
Chloride: 105 mmol/L (ref 98–111)
Creatinine, Ser: 0.77 mg/dL (ref 0.61–1.24)
GFR, Estimated: >60 mL/min (ref >60)
Glucose, Bld: 101 mg/dL — ABNORMAL HIGH (ref 70–99)
Potassium: 3.7 mmol/L (ref 3.5–5.1)
Sodium: 136 mmol/L (ref 135–145)

## 2021-01-10 LAB — CBC WITH DIFFERENTIAL/PLATELET
Abs Immature Granulocytes: 0.29 10*3/uL — ABNORMAL HIGH (ref 0.00–0.07)
Basophils Absolute: 0 10*3/uL (ref 0.0–0.1)
Basophils Relative: 0 %
Eosinophils Absolute: 0 10*3/uL (ref 0.0–0.5)
Eosinophils Relative: 0 %
HCT: 29.8 % — ABNORMAL LOW (ref 39.0–52.0)
Hemoglobin: 9.3 g/dL — ABNORMAL LOW (ref 13.0–17.0)
Immature Granulocytes: 3 %
Lymphocytes Relative: 14 %
Lymphs Abs: 1.2 10*3/uL (ref 0.7–4.0)
MCH: 28.1 pg (ref 26.0–34.0)
MCHC: 31.2 g/dL (ref 30.0–36.0)
MCV: 90 fL (ref 80.0–100.0)
Monocytes Absolute: 0.8 10*3/uL (ref 0.1–1.0)
Monocytes Relative: 9 %
Neutro Abs: 6.4 10*3/uL (ref 1.7–7.7)
Neutrophils Relative %: 74 %
Platelets: 221 10*3/uL (ref 150–400)
RBC: 3.31 MIL/uL — ABNORMAL LOW (ref 4.22–5.81)
RDW: 16.8 % — ABNORMAL HIGH (ref 11.5–15.5)
WBC: 8.6 10*3/uL (ref 4.0–10.5)
nRBC: 0 % (ref 0.0–0.2)

## 2021-01-10 LAB — MAGNESIUM: Magnesium: 1.7 mg/dL (ref 1.7–2.4)

## 2021-01-10 LAB — GLUCOSE, CAPILLARY
Glucose-Capillary: 93 mg/dL (ref 70–99)
Glucose-Capillary: 120 mg/dL — ABNORMAL HIGH (ref 70–99)

## 2021-01-10 NOTE — Consult Note (Signed)
Palliative Medicine Inpatient Consult Note  Reason for consult:  Goals of Care  HPI:  Per intake H&P --> 75 y.o.malewith medical history significant oflaryngeal cancer status post laryngectomy, coronary artery disease, chronic hypoxic respiratory failure on 5 L oxygen via tracheostomy; history of tracheostomy, history of lung cancer, history of spinal stenosis, hypothyroidism and essential hypertension, recent hospitalization in March 2022 for altered mental status secondary to pneumonia treated with antibiotics presented with altered mental status and hypoxia.  On presentation, he had temperature of 100.6, tachycardic to 130s and saturating 93% on 6 L oxygen.  White count was 11.1.  COVID-19 and influenza panels were negative.  Chest x-ray showed mild severity of infiltrate in left upper lobe and left lower lobe with moderate sized left pleural effusion for which a thoracentesis was performed yesterday.   Palliative care is familiar with Ryan Miles as he was last seen on 12/20/2020 by Dr, Rowe Pavy. He at that time was determined to be full code/full scope of care.  Patients receives most care through the [place].  Clinical Assessment/Goals of Care:  *Please note that this is a verbal dictation therefore any spelling or grammatical errors are due to the "[place]" system interpretation.  I have reviewed medical records including EPIC notes, labs and imaging, received report from bedside RN, assessed the patient who was lying in bed in no distress.    I met with Ryan Miles and his daughter, Ryan Miles at bedside to further discuss diagnosis prognosis, Ryan Miles, EOL wishes, disposition and options.   I introduced Palliative Medicine as specialized medical care for people living with serious illness. It focuses on providing relief from the symptoms and stress of a serious illness. The goal is to improve quality of life for both the patient and the family.  Ryan Miles is from [place], [place]  originally. He is divorced. He has one daughter and three grandchildren ages 6,4, & 38. Patient is thanked for his service to the country having served in [place], he gets most of his care at the [place].  He is a retired Designer, fashion/clothing. He gets enjoyment out of spending time with his family. He is not overtly religious.   Prior to March was living by himself but was having some progressive functional decline and more recently has moved in with his daughter in [place], [place].  She helps him with bADL's as he is mostly bed bound.   We reviewed that Risse physical state has been declining over the past few months to the point where he is predominantly in the bed and unable to get up and mobilize.  We reviewed why this is worrisome in the setting of his poor nutritional state, poor strength, in conjunction with the multitude of comorbid conditions he has.  We reviewed that his frailty score is identified is quite high and often is not unreasonable to consider alternative modalities of care such as hospice at this juncture.  Ryan Miles shares with me that hospice had been mentioned at their last [place] appointment and that she was going to move forward with this to preclude Ryan Miles from coming back and forth to the hospital.  A detailed discussion was had today regarding advanced directives - these have all been completed through the [place] system.    Concepts specific to code status, artifical feeding and hydration, continued IV antibiotics and rehospitalization was had.  Patient is DNAR/DNI.   The difference between a aggressive medical intervention path  and a palliative comfort care path for this  patient at this time was had.  I described hospice as a service for patients for have a life expectancy of < 45month. It preserves dignity and quality at the end phases of life. The focus changes from curative to symptom relief.   Discussed the importance of continued conversation with family and their  medical  providers regarding overall plan of care and treatment options, ensuring decisions are within the context of the patients values and GOCs.  Decision Maker: Ryan Miles(Daughter) [phone] SUMMARY OF RECOMMENDATIONS   DNAR/DNI  GGirtha RmDNR placed on chart  TOC - Appreciate aid in offering hospice services  [facility] [facility]#[phone]. [phone] Psychiatrist Dr. RKarle Barr #[phone]. [phone] Ongoing PMT support  Code Status/Advance Care Planning: DNAR/DNI  Palliative Prophylaxis:   Oral Care, ROM  Additional Recommendations (Limitations, Scope, Preferences):  Continue to treat what is treatable  Psycho-social/Spiritual:   Desire for further Chaplaincy support: No - declined  Additional Recommendations: Education on chronic diseases   Prognosis: Hospice appropriate  Discharge Planning: Discharge to home with hospice.  Vitals:   01/09/21 2116 01/10/21 0510  BP: 106/74 121/77  Pulse: 95 94  Resp: 16 20  Temp: 97.9 F (36.6 C) 97.9 F (36.6 C)  SpO2: 92% 99%    Intake/Output Summary (Last 24 hours) at 01/10/2021 04235Last data filed at 01/10/2021 0600 Gross per 24 hour  Intake 3177.81 ml  Output 600 ml  Net 2577.81 ml   Last Weight  Most recent update: 01/08/2021  8:59 PM   Weight  56.6 kg (124 lb 12.5 oz)           Gen:  Frail elderly M in NAD HEENT: Dry mucous membranes, tracheostomy with collar on CV: Regular rate and rhythm  PULM: On Trach collar 2LPM ABD: soft/nontender  EXT: No edema  Neuro: Alert and oriented x3   PPS: 20%   This conversation/these recommendations were discussed with patient primary care team, Dr. AStarla Link Time In: 0900 Time Out: 1010 Total Time: 70 Greater than 50%  of this time was spent counseling and coordinating care related to the above assessment and plan.  MFairviewTeam Team Cell Phone: [phone] utilize secure  chat with additional questions, if there is no response within 30 minutes please call the above phone number  Palliative Medicine Team providers are available by phone from 7am to 7pm daily and can be reached through the team cell phone.  Should this patient require assistance outside of these hours, please call the patient's attending physician.

## 2021-01-10 NOTE — TOC Initial Note (Signed)
Transition of Care [facility]) - Initial/Assessment Note    Patient Details  Name: Ryan Miles MRN: [id number] Date of Birth: Feb 19, 1946  [facility]) CM/SW Contact:    Carles Collet, RN Phone Number: 01/10/2021, 3:45 PM  Clinical Narrative:                Ryan Miles w patient and daughter Ryan Miles at bedside. Verified demographics, and plan for home hospice.  Patient has had a trach for the past 8 years and lives with his daughter, son in law and 3 grandchildren. We identified that he would need suction, hospital bed, and table for discharge. Ryan Miles will need a day or so to set up room for equipment. Family states that they have oxygen through [employer] currently, and are comfortable with transportation home via private car. TOC can assist with medical transportation if need if plans change.  We discussed hospice providers and [facility] was chosen. Referral accepted by Ryan Miles.  15:30 Ryan Miles spoke Ryan Miles. DME to be delivered tomorrow to the home.          Barriers to Discharge: Continued Medical Work up   Patient Goals and CMS Choice Patient states their goals for this hospitalization and ongoing recovery are:: to return home with hospice care CMS Medicare.gov Compare Post Acute Care list provided to:: Other (Comment Required) Choice offered to / list presented to : Adult Children,Patient  Expected Discharge Plan and Services     Discharge Planning Services: CM Consult Post Acute Care Choice: [place] arrangements for the past 2 months: [place] Date [facility] Agency Contacted: 01/10/21 Time HH Agency Contacted: 1000 Representative spoke with at [place]: [place]  Prior Living Arrangements/Services Living arrangements for the past 2 months: [place] with:: Relatives              Current home services: DME    Activities of Daily Living       Permission Sought/Granted                  Emotional Assessment              Admission diagnosis:  Hospital-acquired pneumonia [J18.9, Y95] HCAP (healthcare-associated pneumonia) [J18.9] Patient Active Problem List   Diagnosis Date Noted  . Protein-calorie malnutrition, severe 01/09/2021  . HCAP (healthcare-associated pneumonia) 01/08/2021  . Laryngeal cancer ([place]) 01/08/2021  . 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 Acute hypoxemic respiratory failure ([place])   . Pressure injury of skin 12/19/2020  . Atrial premature contractions 12/19/2020  . Sinus pause 12/19/2020  . 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 Bilateral upper lobe community acquired pneumonia 12/18/2020  . Coronary artery disease involving native heart without angina pectoris 02/19/2012   PCP:  [name], MD Pharmacy:   [facility], [place] [address] Phone: [phone] Fax: [phone]     Social Determinants of Health (SDOH) Interventions    Readmission Risk Interventions No flowsheet data found.

## 2021-01-10 NOTE — Progress Notes (Signed)
Manufacturing engineer [facility])  Received request from [facility] for hospice services at home after discharge.  Chart and pt information under review by [facility] physician.  Hospice eligibility pending at this time.  Hospital liaison spoke with pt's daughter Ryan Miles to initiate education related to hospice philosophy and services and to answer any questions at this time.  Ryan Miles verbalized understanding of information given.  Per discussion the plan is to discharge home Monday.  [place] send signed and completed DNR home with pt/family.  Please provide prescriptions at discharge as needed to ensure ongoing symptom management until pt can be admitted onto hospice services.    DME needs discussed.   Pt currently has 10L oxygen concentrator, over bed table, and BSC.  Pt will need a replacement oxygen concentrator with humidification, suction, and an electric hospital bed.  Ryan Miles reports needing until tomorrow afternoon to prepare space for new bed.  Address has been verified and is correct in the chart.  [facility] information and contact numbers given to daughter Ryan Miles.  Above information shared with [place] Manager.  Please call with any questions or concerns.  Thank you for the opportunity to participate in this pt's care.  Domenic Moras, BSN, RN [facility] [phone] (24h on call)

## 2021-01-10 NOTE — Consult Note (Signed)
NAME:  Ryan Miles, MRN:  [id number], DOB:  [birth year], LOS: 2 ADMISSION DATE:  01/08/2021, CONSULTATION DATE:  4/8 REFERRING MD:  Starla Link, CHIEF COMPLAINT:  Pleural effusion   History of Present Illness:  75 year old male patient who presented to the emergency room from home on 4/7 with new delirium, and what was reported as 2-day history of progressive increased work of breathing with hypoxia.  Noted to have low-grade fever.  Home pulse oximetry in the 80s on 5 L via trach collar.  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  Recently hospitalized From March 17 through March 24 for which he was treated for acute hypoxic respiratory failure in the context of community-acquired pneumonia no organism specified, that course was complicated by delirium, AKI, and frequent sinus dysrhythmia.  Of note he also had swallowing evaluation raising concern for aspiration, with significant dysphagia patient refused PEG Significant Hospital Events: Including procedures, antibiotic start and stop dates in addition to other pertinent events   . 4/7 admitted w/ working dx of PNA and left pleural effusion. BC sent. U strep sent. vanc and cefepime started.  . 4/8 pulm consulted for left pleural effusion and hypoxia. S/p thoracentesis with 650 cc pleural fluid removed  Interim History / Subjective:  Reports improved dypsnea, tolerating trach collar  Objective   Blood pressure 121/77, pulse 94, temperature 97.9 F (36.6 C), resp. rate 20, height 5\' 7"  (1.702 m), weight 56.6 kg, SpO2 99 %.        Intake/Output Summary (Last 24 hours) at 01/10/2021 1734 Last data filed at 01/10/2021 0600 Gross  per 24 hour  Intake 3177.81 ml  Output --  Net 3177.81 ml   Filed Weights   01/08/21 1715 01/08/21 2049  Weight: 62.1 kg 56.6 kg   Physical Exam: General: Chronically ill-appearing, cachectic, no acute distress HENT: [place], AT, OP clear, MMM Eyes: EOMI, no scleral icterus Respiratory: Diminished left breath sounds.  No crackles, wheezing or rales Cardiovascular: RRR, -M/R/G, no JVD Extremities:-Edema,-tenderness Neuro: AAO x4, CNII-XII grossly intact Skin: Intact, no rashes or bruising Psych: Normal mood, normal affect  Labs/imaging that I havepersonally reviewed  (right click and "Reselect all SmartList Selections" daily)   CXR 4/9 - Unchanged left pleural effusion  BMET    Component Value Date/Time   NA 136 01/10/2021 0125   K 3.7 01/10/2021 0125   CL 105 01/10/2021 0125   CO2 27 01/10/2021 0125   GLUCOSE 101 (H) 01/10/2021 0125   BUN 11 01/10/2021 0125   CREATININE 0.77 01/10/2021 0125   CALCIUM 7.6 (L) 01/10/2021 0125   GFRNONAA >60 01/10/2021 0125   GFRAA  12/18/2020 0449    QUESTIONABLE IDENTIFICATION / INCORRECTLY LABELED SPECIMEN  Serum TP 6.1 Serum LDH 111  Pleural studies WBC 1270 (UL 1000), predominantly lymphocytic LD 91 Protein 3.5 Glucose 94  Culture neg x 24h Fungal culture pending  Low suspicion for infection in pleural fluid  Imaging, labs and test noted above have been reviewed independently by me.   Resolved Hospital Problem list    Assessment & Plan:  Acute hypoxic respiratory failure in the setting of recurrent pneumonia.  Suspect aspiration given known history of dysphagia Left pleural effusion s/p thora 4/9, fluid consistent  with transudative process, neg cultures x 24h  Plan Recommend de-escalating antibiotics for CAP coverage for five days total Cont ATC, he needs either this at home or a laritube and HME Wean oxygen for sats > 90% Follow-up final cultures  Discussed findings with primary team including patient's goals of care.   Pulmonary will sign off. Please call for any questions or concerns.  Best practice (right click and "Reselect all SmartList Selections" daily)  Per primary   Labs   CBC: Recent Labs  Lab 01/08/21 1624 01/09/21 0234 01/10/21 0125  WBC 11.0* 11.3* 8.6  NEUTROABS 8.1* 8.1* 6.4  HGB 12.2* 10.1* 9.3*  HCT 38.7* 31.8* 29.8*  MCV 91.1 91.6 90.0  PLT 327 248 381    Basic Metabolic Panel: Recent Labs  Lab 01/08/21 1624 01/09/21 0234 01/10/21 0125  NA 138 138 136  K 4.5 4.4 3.7  CL 102 104 105  CO2 29 28 27   GLUCOSE 110* 93 101*  BUN 11 11 11   CREATININE 0.89 0.83 0.77  CALCIUM 8.5* 7.7* 7.6*  MG  --   --  1.7   GFR: Estimated Creatinine Clearance: 62.9 mL/min (by C-G formula based on SCr of 0.77 mg/dL). Recent Labs  Lab 01/08/21 1624 01/08/21 1718 01/08/21 1930 01/09/21 0234 01/10/21 0125  WBC 11.0*  --   --  11.3* 8.6  LATICACIDVEN  --  1.3 1.7  --   --     Liver Function Tests: Recent Labs  Lab 01/08/21 1624 01/09/21 0234 01/09/21 1157  AST 13* 11*  --   ALT 9 8  --   ALKPHOS 106 83  --   BILITOT 0.9 0.6  --   PROT 7.1 5.9* 6.1*  ALBUMIN 2.2* 1.8* 1.7*   No results for input(s): LIPASE, AMYLASE in the last 168 hours. No results for input(s): AMMONIA in the last 168 hours.  ABG    Component Value Date/Time   PHART 7.436 12/22/2020 1416   PCO2ART 40.7 12/22/2020 1416   PO2ART 56.5 (L) 12/22/2020 1416   HCO3 26.9 12/22/2020 1416   O2SAT 89.7 12/22/2020 1416     Coagulation Profile: No results for input(s): INR, PROTIME in the last 168 hours.  Cardiac Enzymes: No results for input(s): CKTOTAL, CKMB, CKMBINDEX, TROPONINI in the last 168 hours.  HbA1C: No results found for: HGBA1C  CBG: Recent Labs  Lab 01/10/21 0733 01/10/21 1226  GLUCAP 93 120*   Care Time: 35 Minutes.   Rodman Pickle, M.D. [facility] Pulmonary/Critical Care Medicine 01/10/2021 5:34 PM   Please see Amion for pager number to reach on-call Pulmonary and Critical Care  Team.

## 2021-01-10 NOTE — Progress Notes (Signed)
Patient ID: Ryan Miles, male   DOB: 11/30/1945, 75 y.o.   MRN: [id number]  PROGRESS NOTE    Ryan Miles  GNF:[id number] DOB: 08-13-1946 DOA: 01/08/2021 PCP: [name], MD   Brief Narrative:  75 y.o. male with medical history significant of laryngeal cancer status post laryngectomy, coronary artery disease, chronic hypoxic respiratory failure on 5 L oxygen via tracheostomy; history of tracheostomy, history of lung cancer, history of spinal stenosis, hypothyroidism and essential hypertension, recent hospitalization in March 2022 for altered mental status secondary to pneumonia treated with antibiotics presented with altered mental status and hypoxia.  On presentation, he had temperature of 100.6, tachycardic to 130s and saturating 93% on 6 L oxygen.  White count was 11.1.  COVID-19 and influenza panels were negative.  Chest x-ray showed mild severity of infiltrate in left upper lobe and left lower lobe with moderate sized left pleural effusion.  Assessment & Plan:   Left-sided multilobar pneumonia with concern for gram-negative rod/MRSA pneumonia with concern for aspiration pneumonia Moderate left pleural effusion Acute on chronic hypoxic respiratory failure in a patient with history of laryngeal and lung cancer status post tracheostomy -Continue broad-spectrum antibiotics.  Follow cultures. -Pulmonary following: Status post ultrasound-guided thoracentesis on 01/09/2021 and removal of 650 cc pleural fluid.  Pleural fluid cultures negative so far -Has been on 5 L oxygen via trach recently.  Continue oxygen supplementation via trach.  If respiratory status does not improve, might need CT of the chest. -Diet as per SLP recommendations -Outpatient follow-up with his regular oncologist -Stop IV fluids.  Staph epidermidis bacteremia: Present on admission -Questionable cause.  Repeat a.m. blood cultures.  Continue vancomycin.  Acute metabolic encephalopathy -Probably from hypoxia and  pneumonia.  Mental status improving.  Monitor  Chronic pain Generalized deconditioning Failure to thrive/decreased appetite Hypoalbuminemia -Patient is on methadone as an outpatient and daughter states that the dose has been changed increased recently by PCP.  Resume methadone and gabapentin.  Watch for signs of respiratory depression.  Explained to the patient and daughter my concerns regarding use of higher doses of pain medications because of respiratory depression. -Appetite and weight has been gradually decreasing recently as per the daughter. -Palliative care consultation for goals of care discussion. -Dietary consultation  Leukocytosis -Resolved  Anemia of chronic disease -Probably from his chronic illnesses.  Monitor intermittently  Essential hypertension -Blood pressure currently stable.  Monitor  Hypothyroidism -Continue levothyroxine  Constipation -Continue senna.  Offered other laxatives as well if needed: Patient hesitant at this time.    DVT prophylaxis: Lovenox Code Status: Wants intubation but no CPR Family Communication: Daughter at bedside on 01/09/2021 Disposition Plan: Status is: Inpatient  Remains inpatient appropriate because:Inpatient level of care appropriate due to severity of illness   Dispo: The patient is from: Home              Anticipated d/c is to: Home              Patient currently is not medically stable to d/c.   Difficult to place patient No  Consultants: Pulmonary/palliative care  Procedures: Ultrasound-guided thoracentesis on 01/09/2021  Antimicrobials:  Anti-infectives (From admission, onward)   Start     Dose/Rate Route Frequency Ordered Stop   01/09/21 2000  vancomycin (VANCOREADY) IVPB 1250 mg/250 mL  Status:  Discontinued        1,250 mg 166.7 mL/hr over 90 Minutes Intravenous Every 24 hours 01/08/21 2023 01/09/21 0914   01/09/21 2000  vancomycin (VANCOREADY) IVPB 1250 mg/250  mL        1,250 mg 166.7 mL/hr over 90 Minutes  Intravenous Every 24 hours 01/09/21 1413     01/09/21 1400  ceFEPIme (MAXIPIME) 2 g in sodium chloride 0.9 % 100 mL IVPB        2 g 200 mL/hr over 30 Minutes Intravenous Every 8 hours 01/09/21 1141     01/09/21 0800  ceFEPIme (MAXIPIME) 2 g in sodium chloride 0.9 % 100 mL IVPB  Status:  Discontinued        2 g 200 mL/hr over 30 Minutes Intravenous Every 12 hours 01/08/21 2022 01/09/21 0914   01/08/21 2000  levofloxacin (LEVAQUIN) IVPB 750 mg  Status:  Discontinued        750 mg 100 mL/hr over 90 Minutes Intravenous Every 24 hours 01/08/21 1946 01/09/21 0914   01/08/21 1745  ceFEPIme (MAXIPIME) 2 g in sodium chloride 0.9 % 100 mL IVPB        2 g 200 mL/hr over 30 Minutes Intravenous  Once 01/08/21 1733 01/08/21 1927   01/08/21 1745  vancomycin (VANCOREADY) IVPB 1500 mg/300 mL        1,500 mg 150 mL/hr over 120 Minutes Intravenous  Once 01/08/21 1734 01/08/21 2143   01/08/21 1730  aztreonam (AZACTAM) 2 g in sodium chloride 0.9 % 100 mL IVPB  Status:  Discontinued        2 g 200 mL/hr over 30 Minutes Intravenous  Once 01/08/21 1727 01/08/21 1733       Subjective: Patient seen and examined at bedside.  Poor historian.  No overnight fever or vomiting reported.  Nods his head to some questions.  Pain is better and breathing slightly better. Objective: Vitals:   01/09/21 1451 01/09/21 1533 01/09/21 2116 01/10/21 0510  BP: 128/64 128/71 106/74 121/77  Pulse: (!) 120 (!) 123 95 94  Resp: (!) 26 20 16 20   Temp: 98.3 F (36.8 C)  97.9 F (36.6 C) 97.9 F (36.6 C)  TempSrc: Axillary  Oral   SpO2: 92% 97% 92% 99%  Weight:      Height:        Intake/Output Summary (Last 24 hours) at 01/10/2021 0737 Last data filed at 01/10/2021 0600 Gross per 24 hour  Intake 3177.81 ml  Output 600 ml  Net 2577.81 ml   Filed Weights   01/08/21 1715 01/08/21 2049  Weight: 62.1 kg 56.6 kg    Examination:  General exam: No acute distress currently.  Chronically ill looking and very deconditioned and  extremely thinly built elderly male lying in bed.  On supplemental oxygen via tracheostomy currently.   ENT: Lurline Idol present  respiratory system: Decreased breath sounds at bases bilaterally with some crackles, no wheezing; intermittently tachypneic Cardiovascular system: Intermittently tachycardic; S1-S2 heard  gastrointestinal system: Abdomen is slightly distended, soft and nontender.  Bowel sounds are heard  extremities: No lower extremity edema or clubbing Central nervous system: Poor historian; alert.  No focal neurological deficits.  Moves extremities  skin: No obvious petechiae/rashes Psychiatry: Flat affect    Data Reviewed: I have personally reviewed following labs and imaging studies  CBC: Recent Labs  Lab 01/08/21 1624 01/09/21 0234 01/10/21 0125  WBC 11.0* 11.3* 8.6  NEUTROABS 8.1* 8.1* 6.4  HGB 12.2* 10.1* 9.3*  HCT 38.7* 31.8* 29.8*  MCV 91.1 91.6 90.0  PLT 327 248 035   Basic Metabolic Panel: Recent Labs  Lab 01/08/21 1624 01/09/21 0234 01/10/21 0125  NA 138 138 136  K 4.5 4.4  3.7  CL 102 104 105  CO2 29 28 27   GLUCOSE 110* 93 101*  BUN 11 11 11   CREATININE 0.89 0.83 0.77  CALCIUM 8.5* 7.7* 7.6*  MG  --   --  1.7   GFR: Estimated Creatinine Clearance: 62.9 mL/min (by C-G formula based on SCr of 0.77 mg/dL). Liver Function Tests: Recent Labs  Lab 01/08/21 1624 01/09/21 0234 01/09/21 1157  AST 13* 11*  --   ALT 9 8  --   ALKPHOS 106 83  --   BILITOT 0.9 0.6  --   PROT 7.1 5.9* 6.1*  ALBUMIN 2.2* 1.8* 1.7*   No results for input(s): LIPASE, AMYLASE in the last 168 hours. No results for input(s): AMMONIA in the last 168 hours. Coagulation Profile: No results for input(s): INR, PROTIME in the last 168 hours. Cardiac Enzymes: No results for input(s): CKTOTAL, CKMB, CKMBINDEX, TROPONINI in the last 168 hours. BNP (last 3 results) No results for input(s): PROBNP in the last 8760 hours. HbA1C: No results for input(s): HGBA1C in the last 72  hours. CBG: Recent Labs  Lab 01/10/21 0733  GLUCAP 93   Lipid Profile: No results for input(s): CHOL, HDL, LDLCALC, TRIG, CHOLHDL, LDLDIRECT in the last 72 hours. Thyroid Function Tests: No results for input(s): TSH, T4TOTAL, FREET4, T3FREE, THYROIDAB in the last 72 hours. Anemia Panel: No results for input(s): VITAMINB12, FOLATE, FERRITIN, TIBC, IRON, RETICCTPCT in the last 72 hours. Sepsis Labs: Recent Labs  Lab 01/08/21 1718 01/08/21 1930  LATICACIDVEN 1.3 1.7    Recent Results (from the past 240 hour(s))  Blood culture (routine x 2)     Status: None (Preliminary result)   Collection Time: 01/08/21  5:40 PM   Specimen: BLOOD LEFT ARM  Result Value Ref Range Status   Specimen Description BLOOD LEFT ARM  Final   Special Requests   Final    BOTTLES DRAWN AEROBIC AND ANAEROBIC Blood Culture adequate volume   Culture  Setup Time   Final    GRAM POSITIVE COCCI IN CLUSTERS ANAEROBIC BOTTLE ONLY Organism ID to follow CRITICAL RESULT CALLED TO, READ BACK BY AND VERIFIED WITHMarisue Ivan PHARMD 1352 01/09/21 A BROWNING Performed at [facility], [place] [address], [place], [place] [address]    Culture GRAM POSITIVE COCCI IN CLUSTERS  Final   Report Status PENDING  Incomplete  Blood culture (routine x 2)     Status: None (Preliminary result)   Collection Time: 01/08/21  5:40 PM   Specimen: BLOOD  Result Value Ref Range Status   Specimen Description BLOOD LEFT ANTECUBITAL  Final   Special Requests   Final    BOTTLES DRAWN AEROBIC AND ANAEROBIC Blood Culture adequate volume   Culture  Setup Time   Final    GRAM POSITIVE COCCI IN CLUSTERS IN BOTH AEROBIC AND ANAEROBIC BOTTLES IDENTIFICATION TO FOLLOW CRITICAL RESULT CALLED TO, READ BACK BY AND VERIFIED WITHMarisue Ivan PHARMD 1352 01/09/21 A BROWNING Performed at [facility], [place] [address], [place],  [address]    Culture GRAM POSITIVE COCCI IN CLUSTERS  Final   Report Status PENDING  Incomplete  Blood Culture ID  Panel (Reflexed)     Status: Abnormal   Collection Time: 01/08/21  5:40 PM  Result Value Ref Range Status   Enterococcus faecalis NOT DETECTED NOT DETECTED Final   Enterococcus Faecium NOT DETECTED NOT DETECTED Final   Listeria monocytogenes NOT DETECTED NOT DETECTED Final   Staphylococcus species DETECTED (A)  NOT DETECTED Final    Comment: CRITICAL RESULT CALLED TO, READ BACK BY AND VERIFIED WITH: Marisue Ivan PHARMD 1352 01/09/21 A BROWNING    Staphylococcus aureus (BCID) NOT DETECTED NOT DETECTED Final   Staphylococcus epidermidis DETECTED (A) NOT DETECTED Final    Comment: Methicillin (oxacillin) resistant coagulase negative staphylococcus. Possible blood culture contaminant (unless isolated from more than one blood culture draw or clinical case suggests pathogenicity). No antibiotic treatment is indicated for blood  culture contaminants. CRITICAL RESULT CALLED TO, READ BACK BY AND VERIFIED WITH: Marisue Ivan PHARMD 1352 01/09/21 A BROWNING    Staphylococcus lugdunensis NOT DETECTED NOT DETECTED Final   Streptococcus species NOT DETECTED NOT DETECTED Final   Streptococcus agalactiae NOT DETECTED NOT DETECTED Final   Streptococcus pneumoniae NOT DETECTED NOT DETECTED Final   Streptococcus pyogenes NOT DETECTED NOT DETECTED Final   A.calcoaceticus-baumannii NOT DETECTED NOT DETECTED Final   Bacteroides fragilis NOT DETECTED NOT DETECTED Final   Enterobacterales NOT DETECTED NOT DETECTED Final   Enterobacter cloacae complex NOT DETECTED NOT DETECTED Final   Escherichia coli NOT DETECTED NOT DETECTED Final   Klebsiella aerogenes NOT DETECTED NOT DETECTED Final   Klebsiella oxytoca NOT DETECTED NOT DETECTED Final   Klebsiella pneumoniae NOT DETECTED NOT DETECTED Final   Proteus species NOT DETECTED NOT DETECTED Final   Salmonella species NOT DETECTED NOT DETECTED Final   Serratia marcescens NOT DETECTED NOT DETECTED Final   Haemophilus influenzae NOT DETECTED NOT DETECTED Final   Neisseria  meningitidis NOT DETECTED NOT DETECTED Final   Pseudomonas aeruginosa NOT DETECTED NOT DETECTED Final   Stenotrophomonas maltophilia NOT DETECTED NOT DETECTED Final   Candida albicans NOT DETECTED NOT DETECTED Final   Candida auris NOT DETECTED NOT DETECTED Final   Candida glabrata NOT DETECTED NOT DETECTED Final   Candida krusei NOT DETECTED NOT DETECTED Final   Candida parapsilosis NOT DETECTED NOT DETECTED Final   Candida tropicalis NOT DETECTED NOT DETECTED Final   Cryptococcus neoformans/gattii NOT DETECTED NOT DETECTED Final   Methicillin resistance mecA/C DETECTED (A) NOT DETECTED Final    Comment: CRITICAL RESULT CALLED TO, READ BACK BY AND VERIFIED WITHMarisue Ivan PHARMD 1352 01/09/21 A BROWNING Performed at [facility], [address], [place],  [address]   Resp Panel by RT-PCR (Flu A&B, Covid) Nasopharyngeal Swab     Status: None   Collection Time: 01/08/21  7:55 PM   Specimen: Nasopharyngeal Swab; Nasopharyngeal(NP) swabs in vial transport medium  Result Value Ref Range Status   SARS Coronavirus 2 by RT PCR NEGATIVE NEGATIVE Final    Comment: (NOTE) SARS-CoV-2 target nucleic acids are NOT DETECTED.  The SARS-CoV-2 RNA is generally detectable in upper respiratory specimens during the acute phase of infection. The lowest concentration of SARS-CoV-2 viral copies this assay can detect is 138 copies/mL. A negative result does not preclude SARS-Cov-2 infection and should not be used as the sole basis for treatment or other patient management decisions. A negative result may occur with  improper specimen collection/handling, submission of specimen other than nasopharyngeal swab, presence of viral mutation(s) within the areas targeted by this assay, and inadequate number of viral copies(<138 copies/mL). A negative result must be combined with clinical observations, patient history, and epidemiological information. The expected result is Negative.  Fact Sheet for  Patients:  EntrepreneurPulse.com.au  Fact Sheet for Healthcare Providers:  IncredibleEmployment.be  This test is no t yet approved or cleared by the [place] FDA and  has been authorized for detection and/or  diagnosis of SARS-CoV-2 by FDA under an Emergency Use Authorization (EUA). This EUA will remain  in effect (meaning this test can be used) for the duration of the COVID-19 declaration under Section 564(b)(1) of the Act, 21 U.S.C.section 360bbb-3(b)(1), unless the authorization is terminated  or revoked sooner.       Influenza A by PCR NEGATIVE NEGATIVE Final   Influenza B by PCR NEGATIVE NEGATIVE Final    Comment: (NOTE) The Xpert Xpress SARS-CoV-2/FLU/RSV plus assay is intended as an aid in the diagnosis of influenza from Nasopharyngeal swab specimens and should not be used as a sole basis for treatment. Nasal washings and aspirates are unacceptable for Xpert Xpress SARS-CoV-2/FLU/RSV testing.  Fact Sheet for Patients: EntrepreneurPulse.com.au  Fact Sheet for Healthcare Providers: IncredibleEmployment.be  This test is not yet approved or cleared by the [place] FDA and has been authorized for detection and/or diagnosis of SARS-CoV-2 by FDA under an Emergency Use Authorization (EUA). This EUA will remain in effect (meaning this test can be used) for the duration of the COVID-19 declaration under Section 564(b)(1) of the Act, 21 U.S.C. section 360bbb-3(b)(1), unless the authorization is terminated or revoked.  Performed at [facility], [place] [address] [place], [place] [address]   Body fluid culture w Gram Stain     Status: None (Preliminary result)   Collection Time: 01/09/21 11:39 AM   Specimen: Fluid  Result Value Ref Range Status   Specimen Description FLUID PLEURAL  Final   Special Requests NONE  Final   Gram Stain   Final    ABUNDANT WBC PRESENT, PREDOMINANTLY  MONONUCLEAR NO ORGANISMS SEEN Performed at [facility], [place] [address], [place], [place] [address]    Culture PENDING  Incomplete   Report Status PENDING  Incomplete         Radiology Studies: DG Chest Port 1 View  Result Date: 01/09/2021 CLINICAL DATA:  Status post thoracentesis. EXAM: PORTABLE CHEST 1 VIEW COMPARISON:  January 08, 2021. FINDINGS: Stable cardiomediastinal silhouette. No pneumothorax is noted. Stable moderate left pleural effusion is noted with associated left basilar atelectasis or infiltrate. Bony thorax is unremarkable. IMPRESSION: Stable moderate left pleural effusion is noted with associated left basilar atelectasis or infiltrate. Electronically Signed   By: Marijo Conception M.D.   On: 01/09/2021 15:19   DG Chest Portable 1 View  Result Date: 01/08/2021 CLINICAL DATA:  Tachycardia. EXAM: PORTABLE CHEST 1 VIEW COMPARISON:  December 22, 2020 FINDINGS: Marked severity infiltrate is seen throughout the left lower lobe and suprahilar region of the left upper lobe. This is increased in severity when compared to the prior study. Mild left-sided volume loss is noted. Stable mild to moderate severity right basilar atelectasis and/or infiltrate is seen. A moderate sized left-sided pleural effusion is present. This is also increased in size. No pneumothorax is identified. The heart size and mediastinal contours are within normal limits. Marked severity calcification of the thoracic aorta is seen. Radiopaque surgical clips are seen overlying the soft tissues of the neck. The visualized skeletal structures are unremarkable. IMPRESSION: 1. Marked severity infiltrate left upper lobe and left lower lobe infiltrates, increased in severity when compared to the prior study. 2. Stable mild to moderate severity right basilar atelectasis and/or infiltrate. 3. Moderate size left pleural effusion. Electronically Signed   By: Virgina Norfolk M.D.   On: 01/08/2021 16:52   DG Swallowing Func-Speech  Pathology  Result Date: 01/09/2021 Objective Swallowing Evaluation: Type of Study: MBS-Modified Barium Swallow Study  Patient Details Name: Sylvain Hasten MRN: [id number] Date of Birth: [birth year] Today's Date: 01/09/2021 Time: SLP Start Time (ACUTE ONLY): 5993 -SLP Stop Time (ACUTE ONLY): 1318 SLP Time Calculation (min) (ACUTE ONLY): 26 min Past Medical History: Past Medical History: Diagnosis Date . Coronary artery disease involving native coronary artery 03/2012  -100% RCA occluded-DES x2 [facility]) . History of laryngeal cancer   Squamous cell carcinoma larynx-XRT and laryngectomy . Hypertension  . Lung cancer ([place])  . Spinal stenosis  . ST elevation myocardial infarction (STEMI) of inferior wall ([place]) 03/2012  ([place]) 100% RCA-DES PCI x2 to RCA Past Surgical History: Past Surgical History: Procedure Laterality Date . CORONARY STENT PLACEMENT  03/2012  [facility]: DES x2 to RCA. [facility] LEFT HEART CATH AND CORONARY ANGIOGRAPHY  03/2012  ([facility])-inferior STEMI, 100% RCA. [facility] LUNG LOBECTOMY Right 02/2015  Right lower lobe ([place]) . TOTAL LARYNGECTOMY   . TRANSTHORACIC ECHOCARDIOGRAM  01/22/2020  EF estimated greater than 55%.  Mild LV thickening.  Normal RV function.  Moderate MR.  Otherwise trivial AI, PRN TR.  Otherwise normal HPI: Pt is a 75 yo male with a h/o laryngectomy with TEP, presenting with hypoxia, admitted with sepsis. CXR concerning for PNA. Pt had just made the decision to transition to hospice care PTA but did not have a care team established yet. PMH includes: laryngeal and lung cancer, CAD, spinal stenosis, hypothyroidism, HTN, STEMI  Subjective: pt denies swallowing difficulties Assessment / Plan / Recommendation CHL IP CLINICAL IMPRESSIONS 01/09/2021 Clinical Impression MBS was completed with noted anatomical differences s/p total laryngectomy. There was no barium observed to leak at the level of his TEP, therefore with no aspiration observed throughout the study. Pt did have slow clearance of his esophagus at the  level of his TEP and more superiorly, but this was facilitated with liquid washes and is felt to be more chronic in nature. Pt does endorse a h/o esophageal dilation but says he has not needed this repeated in the last 1-2 years. Recommend resuming his baseline diet of regular solids and thin liquids (allowing pt to choose sosfter options PRN). Additionally, SLP also asked pt and his daughter about his stoma care. He has not had his TEP exchanged in about a year, and it was about a year ago that he also stopped wearing his HME/faceplate, primarily because his UE weakness precluded his ability to care for this independently. This is not something that his daughter has been helping with, but they are agreeable to her learning. Recommend f/u for his TEP with his primary SLP through the [place], but would also consider the potential benefit of HMEs vs humidifcation to facilitate pt's secretions. Please consider ordering SLP for laryngectomy care if indicated.  SLP Visit Diagnosis Dysphagia, unspecified (R13.10) Attention and concentration deficit following -- Frontal lobe and executive function deficit following -- Impact on safety and function No limitations   CHL IP TREATMENT RECOMMENDATION 01/09/2021 Treatment Recommendations No treatment recommended at this time   Prognosis 01/09/2021 Prognosis for Safe Diet Advancement Good Barriers to Reach Goals -- Barriers/Prognosis Comment -- CHL IP DIET RECOMMENDATION 01/09/2021 SLP Diet Recommendations Regular solids;Thin liquid Liquid Administration via Cup;Straw Medication Administration Whole meds with liquid Compensations Slow rate;Small sips/bites;Follow solids with liquid Postural Changes Seated upright at 90 degrees   CHL IP OTHER RECOMMENDATIONS 01/09/2021 Recommended Consults -- Oral Care Recommendations Oral care BID Other Recommendations --   CHL IP FOLLOW UP RECOMMENDATIONS 01/09/2021 Follow up Recommendations Outpatient SLP   CHL IP FREQUENCY AND DURATION  12/19/2020 Speech  Therapy Frequency (ACUTE ONLY) min 1 x/week Treatment Duration 1 week      CHL IP ORAL PHASE 01/09/2021 Oral Phase WFL Oral - Pudding Teaspoon -- Oral - Pudding Cup -- Oral - Honey Teaspoon -- Oral - Honey Cup -- Oral - Nectar Teaspoon -- Oral - Nectar Cup -- Oral - Nectar Straw -- Oral - Thin Teaspoon -- Oral - Thin Cup -- Oral - Thin Straw -- Oral - Puree -- Oral - Mech Soft -- Oral - Regular -- Oral - Multi-Consistency -- Oral - Pill -- Oral Phase - Comment --  CHL IP PHARYNGEAL PHASE 01/09/2021 Pharyngeal Phase WFL Pharyngeal- Pudding Teaspoon -- Pharyngeal -- Pharyngeal- Pudding Cup -- Pharyngeal -- Pharyngeal- Honey Teaspoon -- Pharyngeal -- Pharyngeal- Honey Cup -- Pharyngeal -- Pharyngeal- Nectar Teaspoon -- Pharyngeal -- Pharyngeal- Nectar Cup -- Pharyngeal -- Pharyngeal- Nectar Straw -- Pharyngeal -- Pharyngeal- Thin Teaspoon -- Pharyngeal -- Pharyngeal- Thin Cup -- Pharyngeal -- Pharyngeal- Thin Straw -- Pharyngeal -- Pharyngeal- Puree -- Pharyngeal -- Pharyngeal- Mechanical Soft -- Pharyngeal -- Pharyngeal- Regular -- Pharyngeal -- Pharyngeal- Multi-consistency -- Pharyngeal -- Pharyngeal- Pill -- Pharyngeal -- Pharyngeal Comment --  No flowsheet data found. Osie Bond., M.A. [facility] Pager [phone] Office [phone] 01/09/2021, 3:34 PM                   Scheduled Meds: . DULoxetine  20 mg Oral Daily  . enoxaparin (LOVENOX) injection  40 mg Subcutaneous Q24H  . feeding supplement  237 mL Oral BID BM  . gabapentin  300 mg Oral QID  . levothyroxine  88 mcg Oral Q0600  . lidocaine  1 patch Transdermal Q24H  . methadone  5 mg Oral 5 X Daily  . senna  3-4 tablet Oral QHS   Continuous Infusions: . sodium chloride 75 mL/hr at 01/09/21 1124  . ceFEPime (MAXIPIME) IV 2 g (01/10/21 0557)  . vancomycin 1,250 mg (01/09/21 2150)          Aline August, MD [facility] 01/10/2021, 7:37 AM

## 2021-01-11 LAB — BASIC METABOLIC PANEL
Anion gap: 3 — ABNORMAL LOW (ref 5–15)
BUN: 8 mg/dL (ref 8–23)
CO2: 25 mmol/L (ref 22–32)
Calcium: 7.6 mg/dL — ABNORMAL LOW (ref 8.9–10.3)
Chloride: 106 mmol/L (ref 98–111)
Creatinine, Ser: 0.69 mg/dL (ref 0.61–1.24)
GFR, Estimated: >60 mL/min (ref >60)
Glucose, Bld: 88 mg/dL (ref 70–99)
Potassium: 4 mmol/L (ref 3.5–5.1)
Sodium: 134 mmol/L — ABNORMAL LOW (ref 135–145)

## 2021-01-11 LAB — CBC WITH DIFFERENTIAL/PLATELET
Abs Immature Granulocytes: 0.45 10*3/uL — ABNORMAL HIGH (ref 0.00–0.07)
Basophils Absolute: 0 10*3/uL (ref 0.0–0.1)
Basophils Relative: 0 %
Eosinophils Absolute: 0 10*3/uL (ref 0.0–0.5)
Eosinophils Relative: 0 %
HCT: 30.7 % — ABNORMAL LOW (ref 39.0–52.0)
Hemoglobin: 9.4 g/dL — ABNORMAL LOW (ref 13.0–17.0)
Immature Granulocytes: 7 %
Lymphocytes Relative: 18 %
Lymphs Abs: 1.1 10*3/uL (ref 0.7–4.0)
MCH: 28.2 pg (ref 26.0–34.0)
MCHC: 30.6 g/dL (ref 30.0–36.0)
MCV: 92.2 fL (ref 80.0–100.0)
Monocytes Absolute: 0.7 10*3/uL (ref 0.1–1.0)
Monocytes Relative: 11 %
Neutro Abs: 4.1 10*3/uL (ref 1.7–7.7)
Neutrophils Relative %: 64 %
Platelets: 230 10*3/uL (ref 150–400)
RBC: 3.33 MIL/uL — ABNORMAL LOW (ref 4.22–5.81)
RDW: 17.2 % — ABNORMAL HIGH (ref 11.5–15.5)
WBC: 6.5 10*3/uL (ref 4.0–10.5)
nRBC: 0 % (ref 0.0–0.2)

## 2021-01-11 LAB — CULTURE, BLOOD (ROUTINE X 2)
Special Requests: ADEQUATE
Special Requests: ADEQUATE

## 2021-01-11 LAB — MAGNESIUM: Magnesium: 1.8 mg/dL (ref 1.7–2.4)

## 2021-01-11 MED ORDER — GABAPENTIN 300 MG PO CAPS
300.0000 mg | ORAL_CAPSULE | Freq: Four times a day (QID) | ORAL | Status: DC
  Administered 2021-01-11 – 2021-01-12 (×2): 300 mg via ORAL
  Filled 2021-01-11 (×2): qty 1

## 2021-01-11 MED ORDER — CEFUROXIME AXETIL 500 MG PO TABS: 500.0000 mg | ORAL_TABLET | Freq: Two times a day (BID) | ORAL | 0 refills | Status: AC

## 2021-01-11 MED ORDER — IPRATROPIUM-ALBUTEROL 0.5-2.5 (3) MG/3ML IN SOLN: 3.0000 mL | RESPIRATORY_TRACT | 0 refills | Status: AC | PRN

## 2021-01-11 MED ORDER — LIDOCAINE 5 % EX PTCH: 1.0000 | MEDICATED_PATCH | CUTANEOUS | 0 refills | Status: AC

## 2021-01-11 NOTE — Progress Notes (Signed)
Palliative Medicine Inpatient Follow Up Note  Reason for consult:  Goals of Care  HPI:  Per intake H&P --> 75 y.o.malewith medical history significant oflaryngeal cancer status post laryngectomy, coronary artery disease,chronic hypoxic respiratory failure on 5 L oxygen via tracheostomy; history oftracheostomy, history of lung cancer, history of spinal stenosis, hypothyroidism and essential hypertension, recent hospitalization in March 2022 for altered mental status secondary to pneumonia treated with antibiotics presented with altered mental status and hypoxia. On presentation, he had temperature of 100.6, tachycardic to 130s and saturating 93% on 6 L oxygen. White count was 11.1. COVID-19 and influenza panels were negative. Chest x-ray showed mild severity of infiltrate in left upper lobe and left lower lobe with moderate sized left pleural effusion for which a thoracentesis was performed yesterday.   Palliative care is familiar with Ryan Miles as he was last seen on 12/20/2020 by Dr, Rowe Pavy. He at that time was determined to be full code/full scope of care.  Patients receives most care through the [place].  Today's Discussion (01/11/2021):  *Please note that this is a verbal dictation therefore any spelling or grammatical errors are due to the "[employer]" system interpretation.  Chart reviewed. I was able to speak to patient's bedside RN, Justice Rocher this morning.  She endorses that from what she understands the patient has been stable there were no major events overnight to be reported.  Reviewed that the plan will be for Ryan Miles to discharge home on hospice tomorrow with [facility] hosp[facility].  I was able to meet with Ryan Miles at bedside this morning.  He was resting comfortably and did not appear to be in any distress. It appeared his oxygen saturations had decreased though when he was aroused they normalized.  Questions and concerns addressed   Objective Assessment: Vital  Signs Vitals:   01/10/21 2323 01/11/21 0527  BP: 112/75 109/69  Pulse: (!) 105 100  Resp: 18 20  Temp: 98 F (36.7 C) 98.2 F (36.8 C)  SpO2: 100% 99%    Intake/Output Summary (Last 24 hours) at 01/11/2021 0850 Last data filed at 01/11/2021 0700 Gross per 24 hour  Intake 690 ml  Output 300 ml  Net 390 ml   Last Weight  Most recent update: 01/08/2021  8:59 PM   Weight  56.6 kg (124 lb 12.5 oz)           Gen:  Frail elderly M in NAD HEENT: Dry mucous membranes, tracheostomy with collar on CV: Regular rate and rhythm  PULM: On Trach collar 2LPM ABD: soft/nontender  EXT: No edema  Neuro: Alert and oriented x3   SUMMARY OF RECOMMENDATIONS DNAR/DNI  Gold DNR placed on chart  TOC - Appreciate meeting with patient's daughter to provide her hospice choice.  [facility] has been selected.  It appears that DME's will be delivered today with a plan for transition home as early as tomorrow.  Patient gets most care through [place]: [facility] [phone] Ext. [phone]; Psychiatrist Dr. Karle Barr  #[phone] Ext. [phone]  Ongoing  Incremental PMT support  Time Spent: 25 Greater than 50% of the time was spent in counseling and coordination of care ______________________________________________________________________________________ [place] Team Team Cell Phone: [phone] Please utilize secure chat with additional questions, if there is no response within 30 minutes please call the above phone number  Palliative Medicine Team providers are available by phone from 7am to 7pm daily and can be reached through the team cell phone.  Should  this patient require assistance outside of these hours, please call the patient's attending physician.

## 2021-01-11 NOTE — Discharge Summary (Signed)
Physician Discharge Summary  Ryan Miles VHQ:[id number] DOB: 1946/07/02 DOA: 01/08/2021  PCP: [name], MD  Admit date: 01/08/2021 Discharge date: 01/11/2021  Admitted From: Home Disposition: Home with hospice  Recommendations for Outpatient Follow-up:  1. Follow up with home hospice at earliest [place]: Home hospice Equipment/Devices: Hospice related equipment.  Patient chronically has a trach and is on supplemental oxygen at home  Discharge Condition: Poor CODE STATUS: DNR Diet recommendation: Regular/diet as per SLP recommendations  Brief/Interim Summary: 75 y.o.malewith medical history significant oflaryngeal cancer status post laryngectomy, coronary artery disease, chronic hypoxic respiratory failure on 5 L oxygen via tracheostomy; history of tracheostomy, history of lung cancer, history of spinal stenosis, hypothyroidism and essential hypertension, recent hospitalization in March 2022 for altered mental status secondary to pneumonia treated with antibiotics presented with altered mental status and hypoxia.  On presentation, he had temperature of 100.6, tachycardic to 130s and saturating 93% on 6 L oxygen.  White count was 11.1.  COVID-19 and influenza panels were negative.  Chest x-ray showed mild severity of infiltrate in left upper lobe and left lower lobe with moderate sized left pleural effusion.  Pulmonary was consulted.  He underwent thoracentesis and removal of 650 cc pleural fluid.  Subsequently, respiratory status has improved.  Mental status is stable.  Palliative care evaluated the patient and patient has been made DNR and patient/family want to pursue home hospice.  He will be discharged on oral antibiotics to home hospice once arrangement can be made.  Discharge Diagnoses:   Left-sided multilobar pneumonia with concern for gram-negative rod/MRSA pneumonia with concern for aspiration pneumonia Moderate left pleural effusion Acute on chronic  hypoxic respiratory failure in a patient with history of laryngeal and lung cancer status post tracheostomy -Currently on broad-spectrum antibiotics. -Pulmonary evaluated the patient during this hospitalization: Status post ultrasound-guided thoracentesis on 01/09/2021 and removal of 650 cc pleural fluid.  Pleural fluid cultures negative so far.  Pulmonary signed off on 01/10/2021 once patient/family decided to pursue hospice care. -Has been on 5 L oxygen via trach recently.  Continue oxygen supplementation via trach.  Currently on 2 L oxygen via tracheostomy.  -Diet as per SLP recommendations -will discharge home on 5 more days of Ceftin  Staph hominis bacteremia: Present on admission -Possible contamination.  Antibiotic plan as above.  Acute metabolic encephalopathy -Probably from hypoxia and pneumonia.  Mental status improved.  Chronic pain Generalized deconditioning Failure to thrive/decreased appetite Hypoalbuminemia -Patient is on methadone as an outpatient and daughter states that the dose has been changed increased recently by PCP.  Resumed methadone and gabapentin.  -Appetite and weight has been gradually decreasing recently as per the daughter. -Palliative care evaluated the patient and patient has been made DNR and patient/family want to pursue home hospice.  He will be discharged on oral antibiotics to home hospice once arrangement can be made.  Leukocytosis -Resolved  Anemia of chronic disease -Probably from his chronic illnesses.    Essential hypertension -Blood pressure currently stable.    Hypothyroidism -Continue levothyroxine  Constipation -Continue senna.  Offered other laxatives as well if needed: Patient hesitant at this time.   Discharge Instructions  Discharge Instructions    Diet general   Complete by: As directed    Diet as per SLP recommendations   For home use only DME Nebulizer machine   Complete by: As directed    Patient needs a nebulizer  to treat with the following condition: Chronic respiratory failure with hypoxia ([place])  Length of Need: 6 Months   Increase activity slowly   Complete by: As directed    No wound care   Complete by: As directed      Allergies as of 01/11/2021      Reactions   Rosuvastatin    Other reaction(s): Headache, Other (See Comments) Entire body aches.   Atorvastatin    Other reaction(s): Muscle Pain   Cephalexin    Other reaction(s): Other (See Comments) "fuzzy mouth"   Penicillins    Other reaction(s): Unknown      Medication List    TAKE these medications   acetaminophen 500 MG tablet Commonly known as: TYLENOL Take 1,000 mg by mouth every 6 (six) hours as needed for mild pain.   aspirin 81 MG EC tablet Take 81 mg by mouth daily.   cefUROXime 500 MG tablet Commonly known as: CEFTIN Take 1 tablet (500 mg total) by mouth 2 (two) times daily for 5 days.   cyanocobalamin 1000 MCG tablet Take 1,000 mcg by mouth daily.   DULoxetine 20 MG capsule Commonly known as: CYMBALTA Take 20 mg by mouth daily.   feeding supplement Liqd Take 237 mLs by mouth 2 (two) times daily between meals.   gabapentin 300 MG capsule Commonly known as: NEURONTIN Take 300 mg by mouth 4 (four) times daily.   ipratropium-albuterol 0.5-2.5 (3) MG/3ML Soln Commonly known as: DUONEB Take 3 mLs by nebulization every 4 (four) hours as needed.   levothyroxine 88 MCG tablet Commonly known as: SYNTHROID Take 88 mcg by mouth daily.   lidocaine 5 % Commonly known as: LIDODERM Place 1 patch onto the skin daily. Remove & Discard patch within 12 hours or as directed by MD   methadone 5 MG tablet Commonly known as: DOLOPHINE Take 5 mg by mouth 5 (five) times daily.   senna 8.6 MG tablet Commonly known as: SENOKOT Take 3-4 tablets by mouth at bedtime.            Durable Medical Equipment  (From admission, onward)         Start     Ordered   01/11/21 0000  For home use only DME Nebulizer  machine       Question Answer Comment  Patient needs a nebulizer to treat with the following condition Chronic respiratory failure with hypoxia ([place])   Length of Need 6 Months      01/11/21 1011          Allergies  Allergen Reactions  . Rosuvastatin     Other reaction(s): Headache, Other (See Comments) Entire body aches.  . Atorvastatin     Other reaction(s): Muscle Pain  . Cephalexin     Other reaction(s): Other (See Comments) "fuzzy mouth"  . Penicillins     Other reaction(s): Unknown    Consultations:  Pulmonary/palliative care   Procedures/Studies: DG Chest 2 View  Result Date: 12/18/2020 CLINICAL DATA:  Dyspnea.  Fall. EXAM: CHEST - 2 VIEW COMPARISON:  12/13/2014 FINDINGS: Opacity at both lung bases. No Kerley lines, effusion, or pneumothorax seen. Normal heart size and aortic contours. Surgical clips overlap the thoracic inlet. IMPRESSION: Bilateral lower lobe infiltrate. Electronically Signed   By: Monte Fantasia M.D.   On: 12/18/2020 06:19   DG Thoracic Spine 2 View  Result Date: 12/18/2020 CLINICAL DATA:  Dyspnea and fall EXAM: THORACIC SPINE 2 VIEWS COMPARISON:  None. FINDINGS: No evidence of fracture or bone lesion. Mid and lower thoracic disc narrowing and spurring with 2 non segmented  or ankylosed midthoracic vertebrae. No evident bone lesion. Lower lobe infiltrates described on dedicated chest x-ray. IMPRESSION: No acute finding. Electronically Signed   By: Monte Fantasia M.D.   On: 12/18/2020 06:21   DG Lumbar Spine Complete  Result Date: 12/18/2020 CLINICAL DATA:  Fall with dyspnea EXAM: LUMBAR SPINE - COMPLETE 4+ VIEW COMPARISON:  03/14/2017 FINDINGS: Five lumbar type vertebrae Advanced mid and lower lumbar disc degeneration with disc collapse and ridging that is progressed. Degenerative facet spurring in the lower lumbar spine. Mild L3-4 retrolisthesis, also seen on prior. Slight dextroscoliosis. No overt bone lesion or erosion. Prominent gas within colon  and stool at the rectum. Atherosclerosis. IMPRESSION: 1. No acute finding. 2. Advanced lower lumbar disc degeneration that has progressed from 2018. 3. Prominent colonic gas and rectal stool. Electronically Signed   By: Monte Fantasia M.D.   On: 12/18/2020 06:23   DG Pelvis 1-2 Views  Result Date: 12/18/2020 CLINICAL DATA:  Fall with upper and lower back pain. EXAM: PELVIS - 1-2 VIEW COMPARISON:  None. FINDINGS: Rotated pelvis with no evidence of fracture or subluxation. Lower lumbar spine degeneration. Prominent rectosigmoid stool with 10 cm transverse rectal span. The cecum is gas distended to 11 cm. IMPRESSION: 1. No acute finding in the pelvis. 2. Rectosigmoid distention by stool. Electronically Signed   By: Monte Fantasia M.D.   On: 12/18/2020 06:24   CT Head Wo Contrast  Result Date: 12/18/2020 CLINICAL DATA:  Fall with head trauma EXAM: CT HEAD WITHOUT CONTRAST CT CERVICAL SPINE WITHOUT CONTRAST TECHNIQUE: Multidetector CT imaging of the head and cervical spine was performed following the standard protocol without intravenous contrast. Multiplanar CT image reconstructions of the cervical spine were also generated. COMPARISON:  None. FINDINGS: CT HEAD FINDINGS Brain: No evidence of acute infarction, hemorrhage, hydrocephalus, extra-axial collection or mass lesion/mass effect. Generalized atrophy Vascular: No hyperdense vessel or unexpected calcification. Skull: Normal. Negative for fracture or focal lesion. Sinuses/Orbits: Negative CT CERVICAL SPINE FINDINGS Alignment: Dextroscoliosis. Skull base and vertebrae: No acute fracture Soft tissues and spinal canal: No prevertebral fluid or swelling. No visible canal hematoma. Laryngectomy and neck dissection with transesophageal puncture. Disc levels: Severe facet osteoarthritis with asymmetric bulky spurring on the left. Generalized disc degeneration focally advanced at C6-7. Upper chest: Negative IMPRESSION: No evidence of acute intracranial or cervical  spine injury. Electronically Signed   By: Monte Fantasia M.D.   On: 12/18/2020 06:45   CT Cervical Spine Wo Contrast  Result Date: 12/18/2020 CLINICAL DATA:  Fall with head trauma EXAM: CT HEAD WITHOUT CONTRAST CT CERVICAL SPINE WITHOUT CONTRAST TECHNIQUE: Multidetector CT imaging of the head and cervical spine was performed following the standard protocol without intravenous contrast. Multiplanar CT image reconstructions of the cervical spine were also generated. COMPARISON:  None. FINDINGS: CT HEAD FINDINGS Brain: No evidence of acute infarction, hemorrhage, hydrocephalus, extra-axial collection or mass lesion/mass effect. Generalized atrophy Vascular: No hyperdense vessel or unexpected calcification. Skull: Normal. Negative for fracture or focal lesion. Sinuses/Orbits: Negative CT CERVICAL SPINE FINDINGS Alignment: Dextroscoliosis. Skull base and vertebrae: No acute fracture Soft tissues and spinal canal: No prevertebral fluid or swelling. No visible canal hematoma. Laryngectomy and neck dissection with transesophageal puncture. Disc levels: Severe facet osteoarthritis with asymmetric bulky spurring on the left. Generalized disc degeneration focally advanced at C6-7. Upper chest: Negative IMPRESSION: No evidence of acute intracranial or cervical spine injury. Electronically Signed   By: Monte Fantasia M.D.   On: 12/18/2020 06:45   US RENAL  Result Date:  12/18/2020 CLINICAL DATA:  Acute kidney injury. EXAM: RENAL / URINARY TRACT ULTRASOUND COMPLETE COMPARISON:  None. FINDINGS: Right Kidney: Renal measurements: 8.1 x 3.7 x 4.0 cm = volume: 62 mL. Echogenicity within normal limits. No mass or hydronephrosis visualized. Left Kidney: Not visualized due to overlying bowel gas. Bladder: Appears normal for degree of bladder distention. Other: None. IMPRESSION: Normal right kidney. Left kidney not visualized due to overlying bowel gas. Electronically Signed   By: Marijo Conception M.D.   On: 12/18/2020 15:29    CT Chest High Resolution  Result Date: 12/23/2020 CLINICAL DATA:  75 year old male with history of respiratory failure. History of esophageal carcinoma and lung carcinoma. EXAM: CT CHEST WITHOUT CONTRAST TECHNIQUE: Multidetector CT imaging of the chest was performed following the standard protocol without intravenous contrast. High resolution imaging of the lungs, as well as inspiratory and expiratory imaging, was performed. COMPARISON:  No priors. FINDINGS: Cardiovascular: Heart size is normal. There is no significant pericardial fluid, thickening or pericardial calcification. There is aortic atherosclerosis, as well as atherosclerosis of the great vessels of the mediastinum and the coronary arteries, including calcified atherosclerotic plaque in the left main, left anterior descending, left circumflex and right coronary arteries. Mild calcifications of the aortic valve. Moderate calcifications of the mitral annulus. Mediastinum/Nodes: Status post laryngectomy. Multiple prominent borderline enlarged and mildly enlarged mediastinal and hilar lymph nodes are noted, measuring up to 1.5 cm in short axis in the low right paratracheal nodal station. Esophagus is unremarkable in appearance. No axillary lymphadenopathy. Lungs/Pleura: Dependent areas of airspace consolidation are noted in the lungs bilaterally, most evident in the lower lobes, but also involving the dependent portions of the left upper lobe. Additional patchy areas of mild peribronchovascular ground-glass attenuation are noted, most evident in the right upper lobe. Scattered areas of thickening of the peribronchovascular interstitium. Small bilateral pleural effusions. Inspiratory and expiratory imaging is unremarkable. Retained secretions are noted in the trachea and right mainstem bronchus. Upper Abdomen: Aortic atherosclerosis.  Status post cholecystectomy. Musculoskeletal: There are no aggressive appearing lytic or blastic lesions noted in the  visualized portions of the skeleton. IMPRESSION: 1. Dependent areas of airspace consolidation throughout the lungs bilaterally, concerning for potential aspiration pneumonia. Small bilateral parapneumonic pleural effusions. 2. Multiple prominent borderline enlarged and mildly enlarged mediastinal bilateral hilar lymph nodes, nonspecific in the setting of pneumonia. Attention on follow-up studies is recommended to ensure regression of these findings, particularly in light of the patient's history of malignancy. 3. Aortic atherosclerosis, in addition to left main and 3 vessel coronary artery disease. Assessment for potential risk factor modification, dietary therapy or pharmacologic therapy may be warranted, if clinically indicated. 4. There are calcifications of the aortic valve and mitral annulus. Echocardiographic correlation for evaluation of potential valvular dysfunction may be warranted if clinically indicated. Aortic Atherosclerosis (ICD10-I70.0). Electronically Signed   By: Vinnie Langton M.D.   On: 12/23/2020 07:17   DG Chest Port 1 View  Result Date: 01/10/2021 CLINICAL DATA:  Shortness of breath EXAM: PORTABLE CHEST 1 VIEW COMPARISON:  January 09, 2021 and January 08, 2021 FINDINGS: There is persistent effusion on the left with consolidation throughout left mid and lower lung regions. Minimal right pleural effusion noted. No edema or airspace opacity evident on the right. Heart is prominent with pulmonary vascularity within normal limits, stable. No adenopathy. Postoperative change noted in the cervicothoracic region. No bone lesions. IMPRESSION: Persistent pleural effusion on the left with consolidation throughout much of the left mid and lower lung regions.  Rather minimal right pleural effusion noted. Stable cardiac silhouette. Comment: Contrast no longer appreciable in the esophagus. Electronically Signed   By: Lowella Grip III M.D.   On: 01/10/2021 09:33   DG Chest Port 1 View  Result Date:  01/09/2021 CLINICAL DATA:  Status post thoracentesis. EXAM: PORTABLE CHEST 1 VIEW COMPARISON:  January 08, 2021. FINDINGS: Stable cardiomediastinal silhouette. No pneumothorax is noted. Stable moderate left pleural effusion is noted with associated left basilar atelectasis or infiltrate. Bony thorax is unremarkable. IMPRESSION: Stable moderate left pleural effusion is noted with associated left basilar atelectasis or infiltrate. Electronically Signed   By: Marijo Conception M.D.   On: 01/09/2021 15:19   DG Chest Portable 1 View  Result Date: 01/08/2021 CLINICAL DATA:  Tachycardia. EXAM: PORTABLE CHEST 1 VIEW COMPARISON:  December 22, 2020 FINDINGS: Marked severity infiltrate is seen throughout the left lower lobe and suprahilar region of the left upper lobe. This is increased in severity when compared to the prior study. Mild left-sided volume loss is noted. Stable mild to moderate severity right basilar atelectasis and/or infiltrate is seen. A moderate sized left-sided pleural effusion is present. This is also increased in size. No pneumothorax is identified. The heart size and mediastinal contours are within normal limits. Marked severity calcification of the thoracic aorta is seen. Radiopaque surgical clips are seen overlying the soft tissues of the neck. The visualized skeletal structures are unremarkable. IMPRESSION: 1. Marked severity infiltrate left upper lobe and left lower lobe infiltrates, increased in severity when compared to the prior study. 2. Stable mild to moderate severity right basilar atelectasis and/or infiltrate. 3. Moderate size left pleural effusion. Electronically Signed   By: Virgina Norfolk M.D.   On: 01/08/2021 16:52   DG Chest Port 1 View  Result Date: 12/22/2020 CLINICAL DATA:  History of pneumonia.  Weakness. EXAM: PORTABLE CHEST 1 VIEW COMPARISON:  Chest x-ray 12/18/2020. FINDINGS: Bibasilar opacities may reflect areas of atelectasis and/or consolidation with superimposed small  bilateral pleural effusions, overall slightly worse compared to the prior study. No pneumothorax. No evidence of pulmonary edema. Heart size is normal. Upper mediastinal contours are within normal limits. Aortic atherosclerosis. Surgical clips in the region of the thoracic inlet, likely from prior thyroidectomy. IMPRESSION: 1. Worsening bibasilar opacities which may reflect increasing areas of atelectasis and/or consolidation, with superimposed small bilateral pleural effusions which also appear increased. Electronically Signed   By: Vinnie Langton M.D.   On: 12/22/2020 09:03   DG Chest Port 1 View  Result Date: 12/18/2020 CLINICAL DATA:  Questionable sepsis EXAM: PORTABLE CHEST 1 VIEW COMPARISON:  None. FINDINGS: Infiltrates at the lung bases with small left pleural effusion. Normal heart size and mediastinal contours. Lucent appearance of the upper lungs but no generalized hyperinflation. Postoperative neck. IMPRESSION: Left more than right lower lobe infiltrates. Electronically Signed   By: Monte Fantasia M.D.   On: 12/18/2020 07:09   DG Swallowing Func-Speech Pathology  Result Date: 01/09/2021 Objective Swallowing Evaluation: Type of Study: MBS-Modified Barium Swallow Study  Patient Details Name: Malon Branton MRN: [id number] Date of Birth: Dec 02, 1945 Today's Date: 01/09/2021 Time: SLP Start Time (ACUTE ONLY): 3875 -SLP Stop Time (ACUTE ONLY): 1318 SLP Time Calculation (min) (ACUTE ONLY): 26 min Past Medical History: Past Medical History: Diagnosis Date . Coronary artery disease involving native coronary artery 03/2012  -100% RCA occluded-DES x2 [facility]) . History of laryngeal cancer   Squamous cell carcinoma larynx-XRT and laryngectomy . Hypertension  . Lung cancer ([place])  . Spinal stenosis  .  ST elevation myocardial infarction (STEMI) of inferior wall ([place]) 03/2012  ([place]) 100% RCA-DES PCI x2 to RCA Past Surgical History: Past Surgical History: Procedure Laterality Date . CORONARY STENT PLACEMENT  03/2012   [facility]: DES x2 to RCA. [facility] LEFT HEART CATH AND CORONARY ANGIOGRAPHY  03/2012  ([facility])-inferior STEMI, 100% RCA. [facility] LUNG LOBECTOMY Right 02/2015  Right lower lobe ([place]) . TOTAL LARYNGECTOMY   . TRANSTHORACIC ECHOCARDIOGRAM  01/22/2020  EF estimated greater than 55%.  Mild LV thickening.  Normal RV function.  Moderate MR.  Otherwise trivial AI, PRN TR.  Otherwise normal HPI: Pt is a 75 yo male with a h/o laryngectomy with TEP, presenting with hypoxia, admitted with sepsis. CXR concerning for PNA. Pt had just made the decision to transition to hospice care PTA but did not have a care team established yet. PMH includes: laryngeal and lung cancer, CAD, spinal stenosis, hypothyroidism, HTN, STEMI  Subjective: pt denies swallowing difficulties Assessment / Plan / Recommendation CHL IP CLINICAL IMPRESSIONS 01/09/2021 Clinical Impression MBS was completed with noted anatomical differences s/p total laryngectomy. There was no barium observed to leak at the level of his TEP, therefore with no aspiration observed throughout the study. Pt did have slow clearance of his esophagus at the level of his TEP and more superiorly, but this was facilitated with liquid washes and is felt to be more chronic in nature. Pt does endorse a h/o esophageal dilation but says he has not needed this repeated in the last 1-2 years. Recommend resuming his baseline diet of regular solids and thin liquids (allowing pt to choose sosfter options PRN). Additionally, SLP also asked pt and his daughter about his stoma care. He has not had his TEP exchanged in about a year, and it was about a year ago that he also stopped wearing his HME/faceplate, primarily because his UE weakness precluded his ability to care for this independently. This is not something that his daughter has been helping with, but they are agreeable to her learning. Recommend f/u for his TEP with his primary SLP through the [place], but would also consider the potential benefit of HMEs vs  humidifcation to facilitate pt's secretions. Please consider ordering SLP for laryngectomy care if indicated.  SLP Visit Diagnosis Dysphagia, unspecified (R13.10) Attention and concentration deficit following -- Frontal lobe and executive function deficit following -- Impact on safety and function No limitations   CHL IP TREATMENT RECOMMENDATION 01/09/2021 Treatment Recommendations No treatment recommended at this time   Prognosis 01/09/2021 Prognosis for Safe Diet Advancement Good Barriers to Reach Goals -- Barriers/Prognosis Comment -- CHL IP DIET RECOMMENDATION 01/09/2021 SLP Diet Recommendations Regular solids;Thin liquid Liquid Administration via Cup;Straw Medication Administration Whole meds with liquid Compensations Slow rate;Small sips/bites;Follow solids with liquid Postural Changes Seated upright at 90 degrees   CHL IP OTHER RECOMMENDATIONS 01/09/2021 Recommended Consults -- Oral Care Recommendations Oral care BID Other Recommendations --   CHL IP FOLLOW UP RECOMMENDATIONS 01/09/2021 Follow up Recommendations Outpatient SLP   CHL IP FREQUENCY AND DURATION 12/19/2020 Speech Therapy Frequency (ACUTE ONLY) min 1 x/week Treatment Duration 1 week      CHL IP ORAL PHASE 01/09/2021 Oral Phase WFL Oral - Pudding Teaspoon -- Oral - Pudding Cup -- Oral - Honey Teaspoon -- Oral - Honey Cup -- Oral - Nectar Teaspoon -- Oral - Nectar Cup -- Oral - Nectar Straw -- Oral - Thin Teaspoon -- Oral - Thin Cup -- Oral - Thin Straw -- Oral - Puree -- Oral - Mech  Soft -- Oral - Regular -- Oral - Multi-Consistency -- Oral - Pill -- Oral Phase - Comment --  CHL IP PHARYNGEAL PHASE 01/09/2021 Pharyngeal Phase WFL Pharyngeal- Pudding Teaspoon -- Pharyngeal -- Pharyngeal- Pudding Cup -- Pharyngeal -- Pharyngeal- Honey Teaspoon -- Pharyngeal -- Pharyngeal- Honey Cup -- Pharyngeal -- Pharyngeal- Nectar Teaspoon -- Pharyngeal -- Pharyngeal- Nectar Cup -- Pharyngeal -- Pharyngeal- Nectar Straw -- Pharyngeal -- Pharyngeal- Thin Teaspoon -- Pharyngeal --  Pharyngeal- Thin Cup -- Pharyngeal -- Pharyngeal- Thin Straw -- Pharyngeal -- Pharyngeal- Puree -- Pharyngeal -- Pharyngeal- Mechanical Soft -- Pharyngeal -- Pharyngeal- Regular -- Pharyngeal -- Pharyngeal- Multi-consistency -- Pharyngeal -- Pharyngeal- Pill -- Pharyngeal -- Pharyngeal Comment --  No flowsheet data found. Osie Bond., M.A. [facility] [place] [facility] Pager [phone] Office [phone] 01/09/2021, 3:34 PM                  Subjective: Patient seen and examined at bedside.  Nods his head to some questions.  Feels okay to go home today.  Denies worsening pain.  No overnight fever, vomiting reported.  Discharge Exam: Vitals:   01/10/21 2323 01/11/21 0527  BP: 112/75 109/69  Pulse: (!) 105 100  Resp: 18 20  Temp: 98 F (36.7 C) 98.2 F (36.8 C)  SpO2: 100% 99%    General: Pt is alert, awake, not in acute distress.  Looks chronically ill, deconditioned and very thinly built.  Currently on 2 L oxygen via tracheostomy Cardiovascular: rate controlled, S1/S2 + Respiratory: bilateral decreased breath sounds at bases with some scattered crackles Abdominal: Soft, NT, ND, bowel sounds + Extremities: no edema, no cyanosis    The results of significant diagnostics from this hospitalization (including imaging, microbiology, ancillary and laboratory) are listed below for reference.     Microbiology: Recent Results (from the past 240 hour(s))  Blood culture (routine x 2)     Status: Abnormal   Collection Time: 01/08/21  5:40 PM   Specimen: BLOOD LEFT ARM  Result Value Ref Range Status   Specimen Description BLOOD LEFT ARM  Final   Special Requests   Final    BOTTLES DRAWN AEROBIC AND ANAEROBIC Blood Culture adequate volume   Culture  Setup Time   Final    GRAM POSITIVE COCCI IN CLUSTERS ANAEROBIC BOTTLE ONLY CRITICAL RESULT CALLED TO, READ BACK BY AND VERIFIED WITH: Marisue Ivan PHARMD [id number] 01/09/21 A BROWNING    Culture (A)  Final    STAPHYLOCOCCUS EPIDERMIDIS THE  SIGNIFICANCE OF ISOLATING THIS ORGANISM FROM A SINGLE SET OF BLOOD CULTURES WHEN MULTIPLE SETS ARE DRAWN IS UNCERTAIN. PLEASE NOTIFY THE MICROBIOLOGY DEPARTMENT WITHIN ONE WEEK IF SPECIATION AND SENSITIVITIES ARE REQUIRED. Performed at [facility], [place] [address] [place], [place] [address]    Report Status 01/11/2021 FINAL  Final  Blood culture (routine x 2)     Status: Abnormal   Collection Time: 01/08/21  5:40 PM   Specimen: BLOOD  Result Value Ref Range Status   Specimen Description BLOOD LEFT ANTECUBITAL  Final   Special Requests   Final    BOTTLES DRAWN AEROBIC AND ANAEROBIC Blood Culture adequate volume   Culture  Setup Time   Final    GRAM POSITIVE COCCI IN CLUSTERS IN BOTH AEROBIC AND ANAEROBIC BOTTLES IDENTIFICATION TO FOLLOW CRITICAL RESULT CALLED TO, READ BACK BY AND VERIFIED WITHMarisue Ivan PHARMD 1352 01/09/21 A BROWNING    Culture (A)  Final    STAPHYLOCOCCUS HOMINIS THE SIGNIFICANCE OF ISOLATING THIS ORGANISM FROM A  SINGLE SET OF BLOOD CULTURES WHEN MULTIPLE SETS ARE DRAWN IS UNCERTAIN. PLEASE NOTIFY THE MICROBIOLOGY DEPARTMENT WITHIN ONE WEEK IF SPECIATION AND SENSITIVITIES ARE REQUIRED. Performed at [facility], [place] [address] [place], [place] [address]    Report Status 01/11/2021 FINAL  Final  Blood Culture ID Panel (Reflexed)     Status: Abnormal   Collection Time: 01/08/21  5:40 PM  Result Value Ref Range Status   Enterococcus faecalis NOT DETECTED NOT DETECTED Final   Enterococcus Faecium NOT DETECTED NOT DETECTED Final   Listeria monocytogenes NOT DETECTED NOT DETECTED Final   Staphylococcus species DETECTED (A) NOT DETECTED Final    Comment: CRITICAL RESULT CALLED TO, READ BACK BY AND VERIFIED WITHMarisue Ivan PHARMD 1352 01/09/21 A BROWNING    Staphylococcus aureus (BCID) NOT DETECTED NOT DETECTED Final   Staphylococcus epidermidis DETECTED (A) NOT DETECTED Final    Comment: Methicillin (oxacillin) resistant coagulase negative staphylococcus.  Possible blood culture contaminant (unless isolated from more than one blood culture draw or clinical case suggests pathogenicity). No antibiotic treatment is indicated for blood  culture contaminants. CRITICAL RESULT CALLED TO, READ BACK BY AND VERIFIED WITH: Marisue Ivan PHARMD 1352 01/09/21 A BROWNING    Staphylococcus lugdunensis NOT DETECTED NOT DETECTED Final   Streptococcus species NOT DETECTED NOT DETECTED Final   Streptococcus agalactiae NOT DETECTED NOT DETECTED Final   Streptococcus pneumoniae NOT DETECTED NOT DETECTED Final   Streptococcus pyogenes NOT DETECTED NOT DETECTED Final   A.calcoaceticus-baumannii NOT DETECTED NOT DETECTED Final   Bacteroides fragilis NOT DETECTED NOT DETECTED Final   Enterobacterales NOT DETECTED NOT DETECTED Final   Enterobacter cloacae complex NOT DETECTED NOT DETECTED Final   Escherichia coli NOT DETECTED NOT DETECTED Final   Klebsiella aerogenes NOT DETECTED NOT DETECTED Final   Klebsiella oxytoca NOT DETECTED NOT DETECTED Final   Klebsiella pneumoniae NOT DETECTED NOT DETECTED Final   Proteus species NOT DETECTED NOT DETECTED Final   Salmonella species NOT DETECTED NOT DETECTED Final   Serratia marcescens NOT DETECTED NOT DETECTED Final   Haemophilus influenzae NOT DETECTED NOT DETECTED Final   Neisseria meningitidis NOT DETECTED NOT DETECTED Final   Pseudomonas aeruginosa NOT DETECTED NOT DETECTED Final   Stenotrophomonas maltophilia NOT DETECTED NOT DETECTED Final   Candida albicans NOT DETECTED NOT DETECTED Final   Candida auris NOT DETECTED NOT DETECTED Final   Candida glabrata NOT DETECTED NOT DETECTED Final   Candida krusei NOT DETECTED NOT DETECTED Final   Candida parapsilosis NOT DETECTED NOT DETECTED Final   Candida tropicalis NOT DETECTED NOT DETECTED Final   Cryptococcus neoformans/gattii NOT DETECTED NOT DETECTED Final   Methicillin resistance mecA/C DETECTED (A) NOT DETECTED Final    Comment: CRITICAL RESULT CALLED TO, READ BACK BY  AND VERIFIED WITHMarisue Ivan PHARMD 1352 01/09/21 A BROWNING Performed at [facility], [address], [place], [place] [address]   Resp Panel by RT-PCR (Flu A&B, Covid) Nasopharyngeal Swab     Status: None   Collection Time: 01/08/21  7:55 PM   Specimen: Nasopharyngeal Swab; Nasopharyngeal(NP) swabs in vial transport medium  Result Value Ref Range Status   SARS Coronavirus 2 by RT PCR NEGATIVE NEGATIVE Final    Comment: (NOTE) SARS-CoV-2 target nucleic acids are NOT DETECTED.  The SARS-CoV-2 RNA is generally detectable in upper respiratory specimens during the acute phase of infection. The lowest concentration of SARS-CoV-2 viral copies this assay can detect is 138 copies/mL. A negative result does not preclude SARS-Cov-2 infection and  should not be used as the sole basis for treatment or other patient management decisions. A negative result may occur with  improper specimen collection/handling, submission of specimen other than nasopharyngeal swab, presence of viral mutation(s) within the areas targeted by this assay, and inadequate number of viral copies(<138 copies/mL). A negative result must be combined with clinical observations, patient history, and epidemiological information. The expected result is Negative.  Fact Sheet for Patients:  EntrepreneurPulse.com.au  Fact Sheet for Healthcare Providers:  IncredibleEmployment.be  This test is no t yet approved or cleared by the [place] FDA and  has been authorized for detection and/or diagnosis of SARS-CoV-2 by FDA under an Emergency Use Authorization (EUA). This EUA will remain  in effect (meaning this test can be used) for the duration of the COVID-19 declaration under Section 564(b)(1) of the Act, 21 U.S.C.section 360bbb-3(b)(1), unless the authorization is terminated  or revoked sooner.       Influenza A by PCR NEGATIVE NEGATIVE Final   Influenza B by PCR NEGATIVE NEGATIVE  Final    Comment: (NOTE) The Xpert Xpress SARS-CoV-2/FLU/RSV plus assay is intended as an aid in the diagnosis of influenza from Nasopharyngeal swab specimens and should not be used as a sole basis for treatment. Nasal washings and aspirates are unacceptable for Xpert Xpress SARS-CoV-2/FLU/RSV testing.  Fact Sheet for Patients: EntrepreneurPulse.com.au  Fact Sheet for Healthcare Providers: IncredibleEmployment.be  This test is not yet approved or cleared by the [place] FDA and has been authorized for detection and/or diagnosis of SARS-CoV-2 by FDA under an Emergency Use Authorization (EUA). This EUA will remain in effect (meaning this test can be used) for the duration of the COVID-19 declaration under Section 564(b)(1) of the Act, 21 U.S.C. section 360bbb-3(b)(1), unless the authorization is terminated or revoked.  Performed at [facility], [place] [address] [place], [place] [address]   Body fluid culture w Gram Stain     Status: None (Preliminary result)   Collection Time: 01/09/21 11:39 AM   Specimen: Fluid  Result Value Ref Range Status   Specimen Description FLUID PLEURAL  Final   Special Requests NONE  Final   Gram Stain   Final    ABUNDANT WBC PRESENT, PREDOMINANTLY MONONUCLEAR NO ORGANISMS SEEN    Culture   Final    NO GROWTH < 24 HOURS Performed at [facility], [place] [address], [place], [place] [address]    Report Status PENDING  Incomplete     Labs: BNP (last 3 results) Recent Labs    12/22/20 1551  BNP 967.5*   Basic Metabolic Panel: Recent Labs  Lab 01/08/21 1624 01/09/21 0234 01/10/21 0125 01/11/21 0116  NA 138 138 136 134*  K 4.5 4.4 3.7 4.0  CL 102 104 105 106  CO2 29 28 27 25   GLUCOSE 110* 93 101* 88  BUN 11 11 11 8   CREATININE 0.89 0.83 0.77 0.69  CALCIUM 8.5* 7.7* 7.6* 7.6*  MG  --   --  1.7 1.8   Liver Function Tests: Recent Labs  Lab 01/08/21 1624 01/09/21 0234 01/09/21 1157   AST 13* 11*  --   ALT 9 8  --   ALKPHOS 106 83  --   BILITOT 0.9 0.6  --   PROT 7.1 5.9* 6.1*  ALBUMIN 2.2* 1.8* 1.7*   No results for input(s): LIPASE, AMYLASE in the last 168 hours. No results for input(s): AMMONIA in the last 168 hours. CBC: Recent Labs  Lab 01/08/21  1624 01/09/21 0234 01/10/21 0125 01/11/21 0116  WBC 11.0* 11.3* 8.6 6.5  NEUTROABS 8.1* 8.1* 6.4 4.1  HGB 12.2* 10.1* 9.3* 9.4*  HCT 38.7* 31.8* 29.8* 30.7*  MCV 91.1 91.6 90.0 92.2  PLT 327 248 221 230   Cardiac Enzymes: No results for input(s): CKTOTAL, CKMB, CKMBINDEX, TROPONINI in the last 168 hours. BNP: Invalid input(s): POCBNP CBG: Recent Labs  Lab 01/10/21 0733 01/10/21 1226  GLUCAP 93 120*   D-Dimer No results for input(s): DDIMER in the last 72 hours. Hgb A1c No results for input(s): HGBA1C in the last 72 hours. Lipid Profile No results for input(s): CHOL, HDL, LDLCALC, TRIG, CHOLHDL, LDLDIRECT in the last 72 hours. Thyroid function studies No results for input(s): TSH, T4TOTAL, T3FREE, THYROIDAB in the last 72 hours.  Invalid input(s): FREET3 Anemia work up No results for input(s): VITAMINB12, FOLATE, FERRITIN, TIBC, IRON, RETICCTPCT in the last 72 hours. Urinalysis    Component Value Date/Time   COLORURINE  12/18/2020 0432    QUESTIONABLE IDENTIFICATION / INCORRECTLY LABELED SPECIMEN   APPEARANCEUR  12/18/2020 0432    QUESTIONABLE IDENTIFICATION / INCORRECTLY LABELED SPECIMEN   LABSPEC  12/18/2020 0432    QUESTIONABLE IDENTIFICATION / INCORRECTLY LABELED SPECIMEN   PHURINE  12/18/2020 0432    QUESTIONABLE IDENTIFICATION / INCORRECTLY LABELED SPECIMEN   GLUCOSEU  12/18/2020 0432    QUESTIONABLE IDENTIFICATION / INCORRECTLY LABELED SPECIMEN   HGBUR  12/18/2020 0432    QUESTIONABLE IDENTIFICATION / INCORRECTLY LABELED SPECIMEN   BILIRUBINUR  12/18/2020 0432    QUESTIONABLE IDENTIFICATION / INCORRECTLY LABELED SPECIMEN   KETONESUR  12/18/2020 0432    QUESTIONABLE IDENTIFICATION  / INCORRECTLY LABELED SPECIMEN   PROTEINUR  12/18/2020 0432    QUESTIONABLE IDENTIFICATION / INCORRECTLY LABELED SPECIMEN   NITRITE  12/18/2020 0432    QUESTIONABLE IDENTIFICATION / INCORRECTLY LABELED SPECIMEN   LEUKOCYTESUR  12/18/2020 0432    QUESTIONABLE IDENTIFICATION / INCORRECTLY LABELED SPECIMEN   Sepsis Labs Invalid input(s): PROCALCITONIN,  WBC,  LACTICIDVEN Microbiology Recent Results (from the past 240 hour(s))  Blood culture (routine x 2)     Status: Abnormal   Collection Time: 01/08/21  5:40 PM   Specimen: BLOOD LEFT ARM  Result Value Ref Range Status   Specimen Description BLOOD LEFT ARM  Final   Special Requests   Final    BOTTLES DRAWN AEROBIC AND ANAEROBIC Blood Culture adequate volume   Culture  Setup Time   Final    GRAM POSITIVE COCCI IN CLUSTERS ANAEROBIC BOTTLE ONLY CRITICAL RESULT CALLED TO, READ BACK BY AND VERIFIED WITH: Marisue Ivan PHARMD 1352 01/09/21 A BROWNING    Culture (A)  Final    STAPHYLOCOCCUS EPIDERMIDIS THE SIGNIFICANCE OF ISOLATING THIS ORGANISM FROM A SINGLE SET OF BLOOD CULTURES WHEN MULTIPLE SETS ARE DRAWN IS UNCERTAIN. PLEASE NOTIFY THE MICROBIOLOGY DEPARTMENT WITHIN ONE WEEK IF SPECIATION AND SENSITIVITIES ARE REQUIRED. Performed at [facility], [place] [address] [place], [place] [address]    Report Status 01/11/2021 FINAL  Final  Blood culture (routine x 2)     Status: Abnormal   Collection Time: 01/08/21  5:40 PM   Specimen: BLOOD  Result Value Ref Range Status   Specimen Description BLOOD LEFT ANTECUBITAL  Final   Special Requests   Final    BOTTLES DRAWN AEROBIC AND ANAEROBIC Blood Culture adequate volume   Culture  Setup Time   Final    GRAM POSITIVE COCCI IN CLUSTERS IN BOTH AEROBIC AND ANAEROBIC BOTTLES IDENTIFICATION TO FOLLOW CRITICAL RESULT  CALLED TO, READ BACK BY AND VERIFIED WITH: Marisue Ivan PHARMD 1352 01/09/21 A BROWNING    Culture (A)  Final    STAPHYLOCOCCUS HOMINIS THE SIGNIFICANCE OF ISOLATING THIS ORGANISM FROM A  SINGLE SET OF BLOOD CULTURES WHEN MULTIPLE SETS ARE DRAWN IS UNCERTAIN. PLEASE NOTIFY THE MICROBIOLOGY DEPARTMENT WITHIN ONE WEEK IF SPECIATION AND SENSITIVITIES ARE REQUIRED. Performed at [facility], [place] [address] [place], [place] [address]    Report Status 01/11/2021 FINAL  Final  Blood Culture ID Panel (Reflexed)     Status: Abnormal   Collection Time: 01/08/21  5:40 PM  Result Value Ref Range Status   Enterococcus faecalis NOT DETECTED NOT DETECTED Final   Enterococcus Faecium NOT DETECTED NOT DETECTED Final   Listeria monocytogenes NOT DETECTED NOT DETECTED Final   Staphylococcus species DETECTED (A) NOT DETECTED Final    Comment: CRITICAL RESULT CALLED TO, READ BACK BY AND VERIFIED WITHMarisue Ivan PHARMD 1352 01/09/21 A BROWNING    Staphylococcus aureus (BCID) NOT DETECTED NOT DETECTED Final   Staphylococcus epidermidis DETECTED (A) NOT DETECTED Final    Comment: Methicillin (oxacillin) resistant coagulase negative staphylococcus. Possible blood culture contaminant (unless isolated from more than one blood culture draw or clinical case suggests pathogenicity). No antibiotic treatment is indicated for blood  culture contaminants. CRITICAL RESULT CALLED TO, READ BACK BY AND VERIFIED WITH: Marisue Ivan PHARMD 1352 01/09/21 A BROWNING    Staphylococcus lugdunensis NOT DETECTED NOT DETECTED Final   Streptococcus species NOT DETECTED NOT DETECTED Final   Streptococcus agalactiae NOT DETECTED NOT DETECTED Final   Streptococcus pneumoniae NOT DETECTED NOT DETECTED Final   Streptococcus pyogenes NOT DETECTED NOT DETECTED Final   A.calcoaceticus-baumannii NOT DETECTED NOT DETECTED Final   Bacteroides fragilis NOT DETECTED NOT DETECTED Final   Enterobacterales NOT DETECTED NOT DETECTED Final   Enterobacter cloacae complex NOT DETECTED NOT DETECTED Final   Escherichia coli NOT DETECTED NOT DETECTED Final   Klebsiella aerogenes NOT DETECTED NOT DETECTED Final   Klebsiella oxytoca NOT  DETECTED NOT DETECTED Final   Klebsiella pneumoniae NOT DETECTED NOT DETECTED Final   Proteus species NOT DETECTED NOT DETECTED Final   Salmonella species NOT DETECTED NOT DETECTED Final   Serratia marcescens NOT DETECTED NOT DETECTED Final   Haemophilus influenzae NOT DETECTED NOT DETECTED Final   Neisseria meningitidis NOT DETECTED NOT DETECTED Final   Pseudomonas aeruginosa NOT DETECTED NOT DETECTED Final   Stenotrophomonas maltophilia NOT DETECTED NOT DETECTED Final   Candida albicans NOT DETECTED NOT DETECTED Final   Candida auris NOT DETECTED NOT DETECTED Final   Candida glabrata NOT DETECTED NOT DETECTED Final   Candida krusei NOT DETECTED NOT DETECTED Final   Candida parapsilosis NOT DETECTED NOT DETECTED Final   Candida tropicalis NOT DETECTED NOT DETECTED Final   Cryptococcus neoformans/gattii NOT DETECTED NOT DETECTED Final   Methicillin resistance mecA/C DETECTED (A) NOT DETECTED Final    Comment: CRITICAL RESULT CALLED TO, READ BACK BY AND VERIFIED WITHMarisue Ivan PHARMD 1352 01/09/21 A BROWNING Performed at [facility], [address] [place], [place] [address]   Resp Panel by RT-PCR (Flu A&B, Covid) Nasopharyngeal Swab     Status: None   Collection Time: 01/08/21  7:55 PM   Specimen: Nasopharyngeal Swab; Nasopharyngeal(NP) swabs in vial transport medium  Result Value Ref Range Status   SARS Coronavirus 2 by RT PCR NEGATIVE NEGATIVE Final    Comment: (NOTE) SARS-CoV-2 target nucleic acids are NOT DETECTED.  The SARS-CoV-2 RNA is  generally detectable in upper respiratory specimens during the acute phase of infection. The lowest concentration of SARS-CoV-2 viral copies this assay can detect is 138 copies/mL. A negative result does not preclude SARS-Cov-2 infection and should not be used as the sole basis for treatment or other patient management decisions. A negative result may occur with  improper specimen collection/handling, submission of specimen other than  nasopharyngeal swab, presence of viral mutation(s) within the areas targeted by this assay, and inadequate number of viral copies(<138 copies/mL). A negative result must be combined with clinical observations, patient history, and epidemiological information. The expected result is Negative.  Fact Sheet for Patients:  EntrepreneurPulse.com.au  Fact Sheet for Healthcare Providers:  IncredibleEmployment.be  This test is no t yet approved or cleared by the [place] FDA and  has been authorized for detection and/or diagnosis of SARS-CoV-2 by FDA under an Emergency Use Authorization (EUA). This EUA will remain  in effect (meaning this test can be used) for the duration of the COVID-19 declaration under Section 564(b)(1) of the Act, 21 U.S.C.section 360bbb-3(b)(1), unless the authorization is terminated  or revoked sooner.       Influenza A by PCR NEGATIVE NEGATIVE Final   Influenza B by PCR NEGATIVE NEGATIVE Final    Comment: (NOTE) The Xpert Xpress SARS-CoV-2/FLU/RSV plus assay is intended as an aid in the diagnosis of influenza from Nasopharyngeal swab specimens and should not be used as a sole basis for treatment. Nasal washings and aspirates are unacceptable for Xpert Xpress SARS-CoV-2/FLU/RSV testing.  Fact Sheet for Patients: EntrepreneurPulse.com.au  Fact Sheet for Healthcare Providers: IncredibleEmployment.be  This test is not yet approved or cleared by the [place] FDA and has been authorized for detection and/or diagnosis of SARS-CoV-2 by FDA under an Emergency Use Authorization (EUA). This EUA will remain in effect (meaning this test can be used) for the duration of the COVID-19 declaration under Section 564(b)(1) of the Act, 21 U.S.C. section 360bbb-3(b)(1), unless the authorization is terminated or revoked.  Performed at [facility], [place] [address], [place], [place] [address]    Body fluid culture w Gram Stain     Status: None (Preliminary result)   Collection Time: 01/09/21 11:39 AM   Specimen: Fluid  Result Value Ref Range Status   Specimen Description FLUID PLEURAL  Final   Special Requests NONE  Final   Gram Stain   Final    ABUNDANT WBC PRESENT, PREDOMINANTLY MONONUCLEAR NO ORGANISMS SEEN    Culture   Final    NO GROWTH < 24 HOURS Performed at [facility], [place] [address], [place], [place] [address]    Report Status PENDING  Incomplete     Time coordinating discharge: 35 minutes  SIGNED:   Aline August, MD  [facility] 01/11/2021, 10:12 AM

## 2021-01-11 NOTE — Progress Notes (Addendum)
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  DME ordered through [place] will be contact to determine when DME is in place.  Thank you, Venia Carbon RN, BSN, [facility] Liaison  **spoke with DME company, they confirm DME is on the delivery truck and will be delivered today. They cannot provide an exact time.  Dtr Lanelle Bal is aware to call [facility] manager once all is ready and then Mr. Sumler can be discharged.

## 2021-01-11 NOTE — TOC Progression Note (Signed)
Transition of Care [facility]) - Progression Note    Patient Details  Name: Ryan Miles MRN: [id number] Date of Birth: 1946/04/02  [facility]) CM/SW Contact  Carles Collet, RN Phone Number: 01/11/2021, 11:45 AM  Clinical Narrative:       Ryan Miles w daughter Ryan Miles. She states that DME is to be delivered to the house later this afternoon. If equipment set up and ready for patient today, she is agreeable to DC today. If equipment is delivered late, will DC tomorrow. Ryan Miles to call me when equipment is delivered.

## 2021-01-11 NOTE — Plan of Care (Signed)

## 2021-01-12 ENCOUNTER — Encounter (HOSPITAL_COMMUNITY): Payer: Self-pay | Admitting: Pulmonary Disease

## 2021-01-12 LAB — BASIC METABOLIC PANEL
Anion gap: 3 — ABNORMAL LOW (ref 5–15)
BUN: 9 mg/dL (ref 8–23)
CO2: 24 mmol/L (ref 22–32)
Calcium: 7.8 mg/dL — ABNORMAL LOW (ref 8.9–10.3)
Chloride: 106 mmol/L (ref 98–111)
Creatinine, Ser: 0.74 mg/dL (ref 0.61–1.24)
GFR, Estimated: >60 mL/min (ref >60)
Glucose, Bld: 88 mg/dL (ref 70–99)
Potassium: 3.8 mmol/L (ref 3.5–5.1)
Sodium: 133 mmol/L — ABNORMAL LOW (ref 135–145)

## 2021-01-12 LAB — PH, BODY FLUID: pH, Body Fluid: 7.9

## 2021-01-12 NOTE — Progress Notes (Signed)
[facility])  Mr. Dawes is being discharged home today by PTAR with [facility] hospice services. [facility] referral center aware and all DME has been set up within the home per daughter Lanelle Bal.   Please feel free to call [facility] with any questions or concerns.  Thank you, Clementeen Hoof, BSN, RN [phone]

## 2021-01-12 NOTE — Progress Notes (Signed)
Patient ID: Ryan Miles, male   DOB: 1945-11-28, 75 y.o.   MRN: [id number] Patient is waiting to be discharged to home with hospice once arrangements have been made.  Patient seen at bedside.  Currently medically stable for discharge.  Overall prognosis is very poor.  Please refer to the full discharge summary done by me on 01/11/2021.

## 2021-01-12 NOTE — Progress Notes (Signed)
Pt is sinus tach, which is baseline and is causing MEWS to be yellow.

## 2021-01-12 NOTE — TOC Transition Note (Signed)
Transition of Care [facility]) - CM/SW Discharge Note   Patient Details  Name: Ryan Miles MRN: [id number] Date of Birth: [birth year]  [facility]) CM/SW Contact:  Joanne Chars, LCSW Phone Number: 01/12/2021, 9:33 AM   Clinical Narrative:   Pt discharging home with [facility] home hospice.  CSW confirmed with daughter Lanelle Bal that all equipment was in the home and she was ready.  PTAR to transport.  [facility] aware as well.      Final next level of care: Home w Hospice Care Barriers to Discharge: Barriers Resolved   Patient Goals and CMS Choice Patient states their goals for this hospitalization and ongoing recovery are:: to return home with hospice care CMS Medicare.gov Compare Post Acute Care list provided to:: Other (Comment Required) Choice offered to / list presented to : Adult Children,Patient  Discharge Placement                  Name of family member notified: Lanelle Bal, daughter Patient and family notified of of transfer: 01/12/21  Discharge Plan and Services   Discharge Planning Services: CM Consult Post Acute Care Choice: [place] Agency: Hospice and [place] Date [place]: 01/10/21 Time [place]: 1000 Representative spoke with at [place] [employer]: [place] Determinants of Health ([place]) Interventions     Readmission Risk Interventions No flowsheet data found.

## 2021-01-13 LAB — BODY FLUID CULTURE W GRAM STAIN: Culture: NO GROWTH

## 2021-01-14 LAB — CHOLESTEROL, BODY FLUID: Cholesterol, Fluid: 46 mg/dL

## 2021-01-14 LAB — CYTOLOGY - NON PAP

## 2021-01-16 LAB — CULTURE, BLOOD (ROUTINE X 2)
Culture: NO GROWTH
Culture: NO GROWTH

## 2021-02-11 LAB — FUNGUS CULTURE WITH STAIN

## 2021-02-11 LAB — FUNGUS CULTURE RESULT

## 2021-02-11 LAB — FUNGAL ORGANISM REFLEX

## 2021-03-04 DEATH — deceased

## 2022-10-07 IMAGING — CR DG PELVIS 1-2V
1 series · 1 of 1 positions shown · non-contrast
Comparison: None.

CLINICAL DATA: Fall with upper and lower back pain.

EXAM:
PELVIS - 1-2 VIEW

[t pelvis ap]
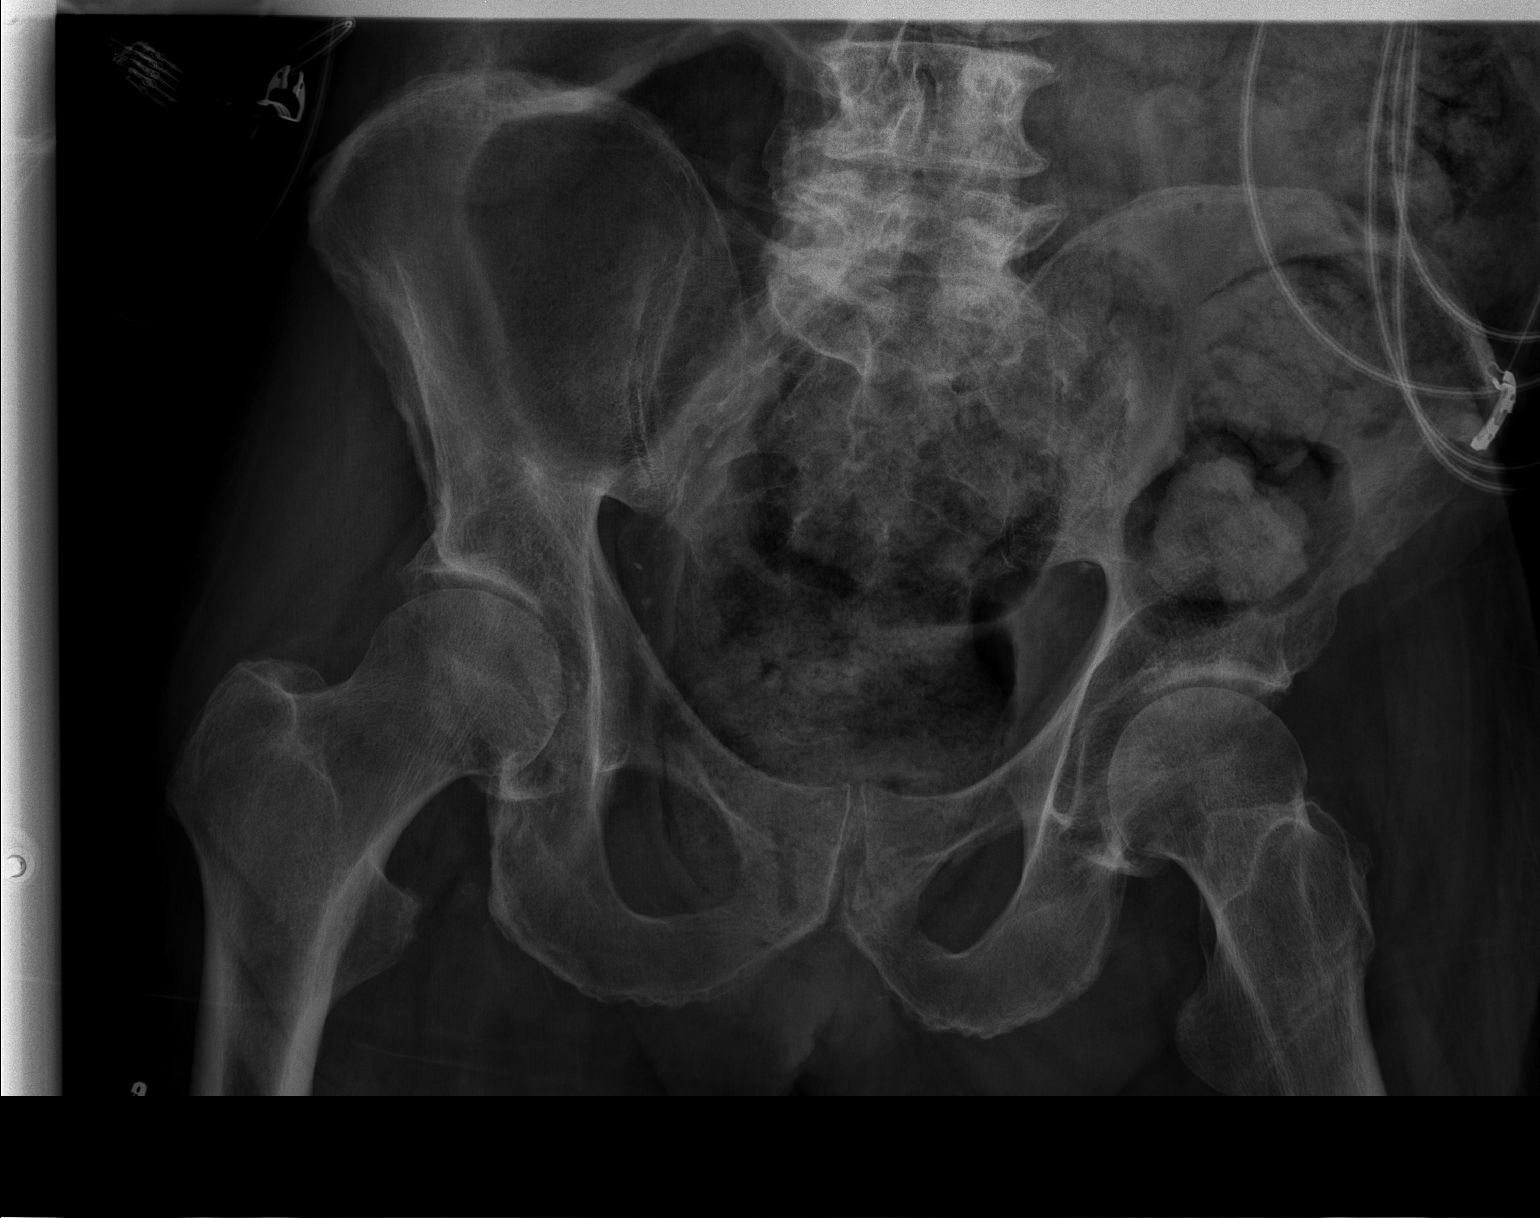

[1 of 1 positions shown; findings below may reference images not displayed]

FINDINGS: Rotated pelvis with no evidence of fracture or subluxation.

Lower lumbar spine degeneration.

Prominent rectosigmoid stool with 10 cm transverse rectal span. The
cecum is gas distended to 11 cm.
IMPRESSION: 1. No acute finding in the pelvis.
2. Rectosigmoid distention by stool.

## 2022-10-07 IMAGING — CT CT CERVICAL SPINE W/O CM
3 of 4 series · 11 of 33 positions shown, 13 images · non-contrast
Comparison: None.

CLINICAL DATA: Fall with head trauma

EXAM:
CT HEAD WITHOUT CONTRAST
CT CERVICAL SPINE WITHOUT CONTRAST
TECHNIQUE: Multidetector CT imaging of the head and cervical spine was
performed following the standard protocol without intravenous
contrast. Multiplanar CT image reconstructions of the cervical spine
were also generated.

[Series 5: orthogonal bone · axial · 0.20mm/px · z∈[-319,-203]mm · 3 of 98 slices shown, 4 images]
[im 17/98  soft-tissue]
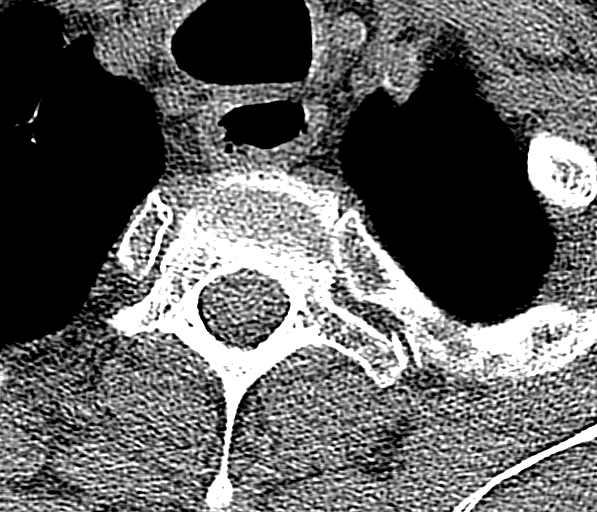
[im 17/98  bone]
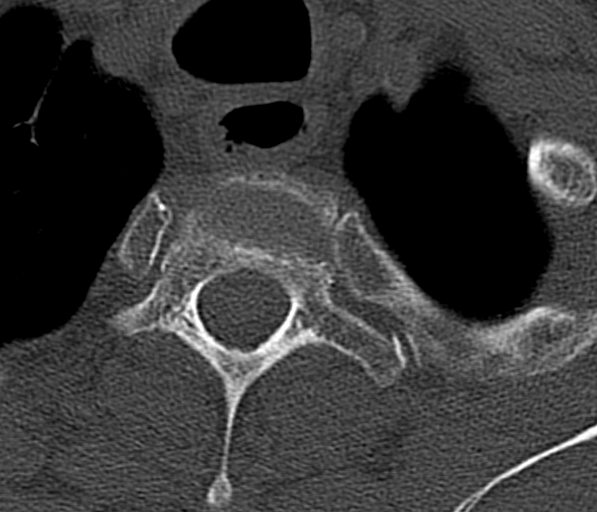
[im 49/98  bone]
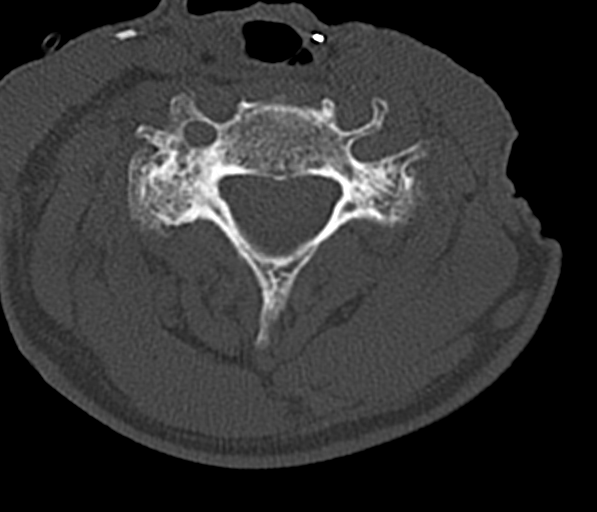
[im 81/98  bone]
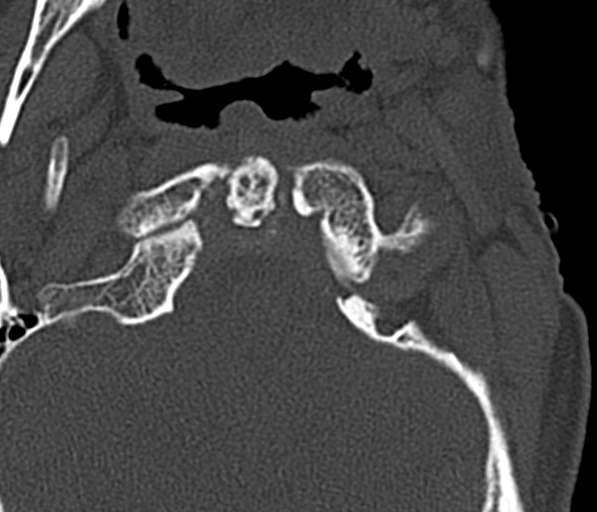

[Series 7: coronal bone · coronal · 0.27mm/px · 3 of 61 slices shown]
[im 15/61  bone]
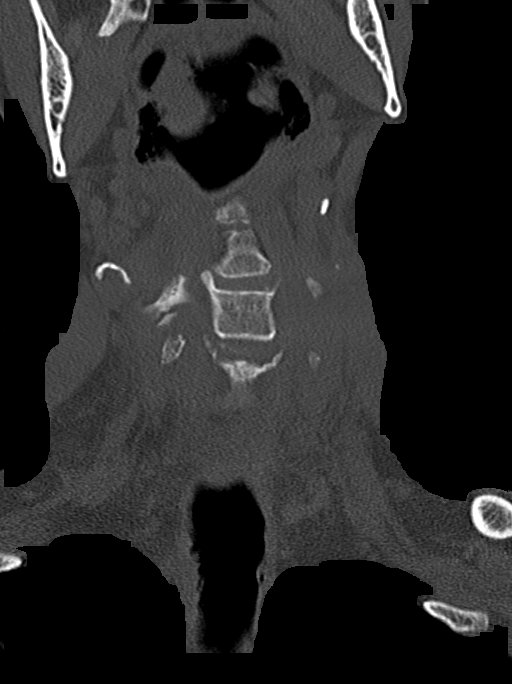
[im 25/61  bone]
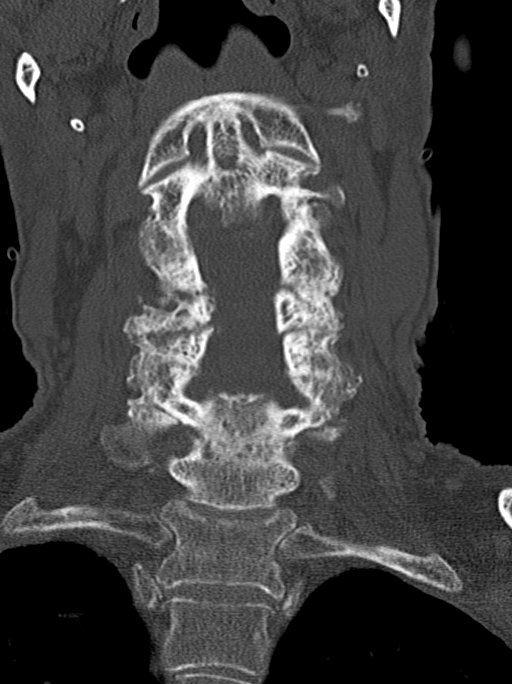
[im 36/61  bone]
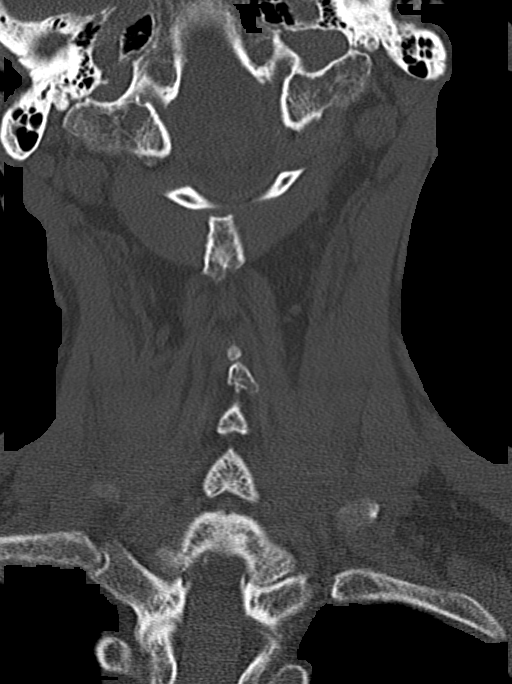

[Series 8: sagittal bone · sagittal · 0.21mm/px · 5 of 53 slices shown, 6 images]
[im 18/53  bone]
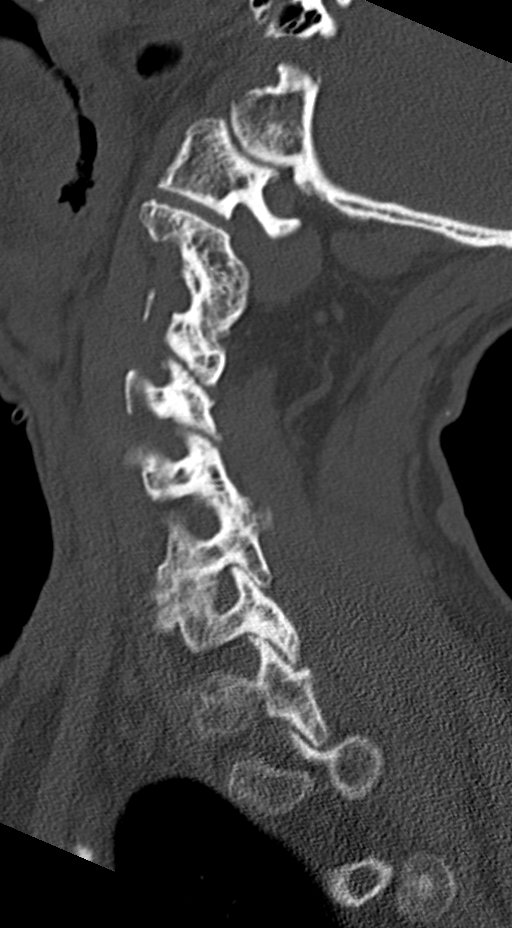
[im 22/53  bone]
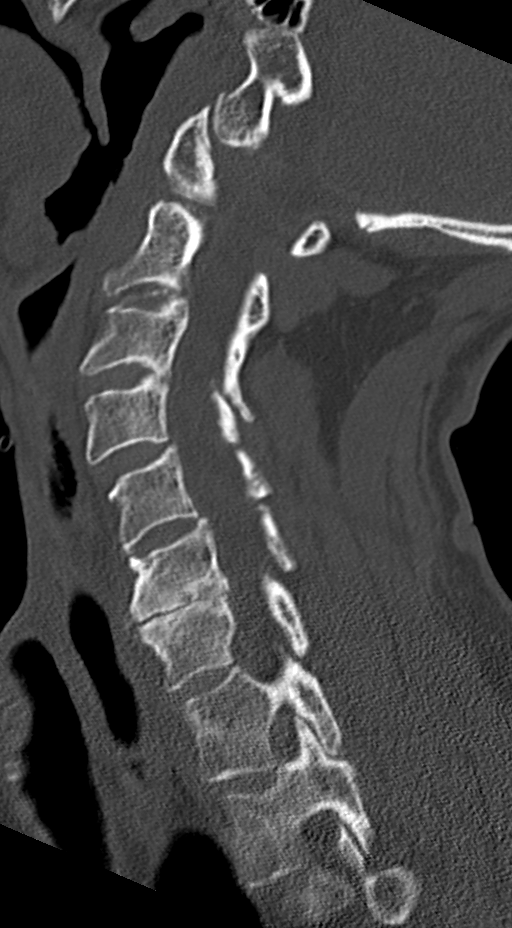
[im 27/53  soft-tissue]
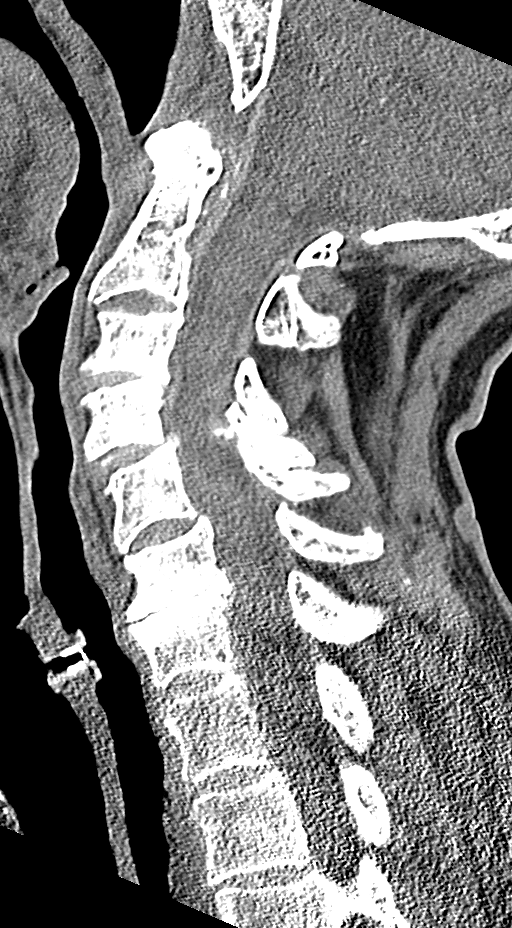
[im 27/53  bone]
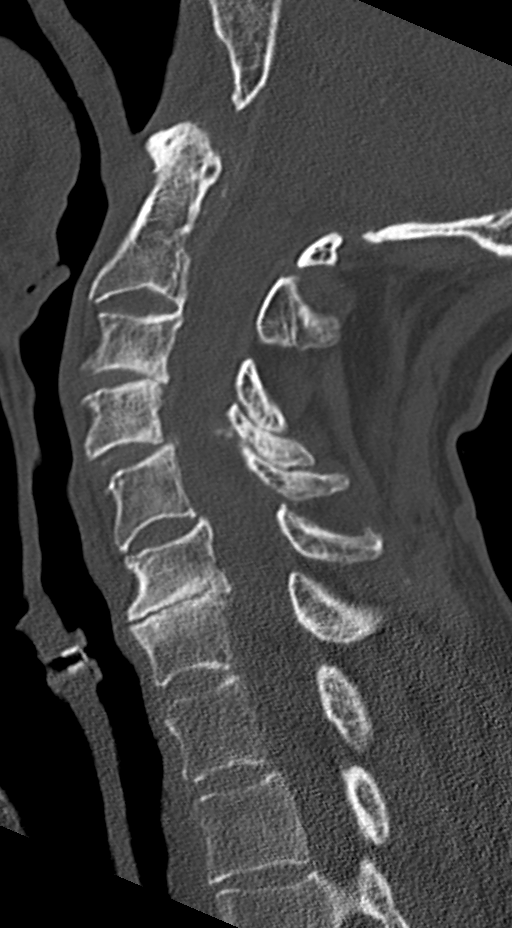
[im 31/53  bone]
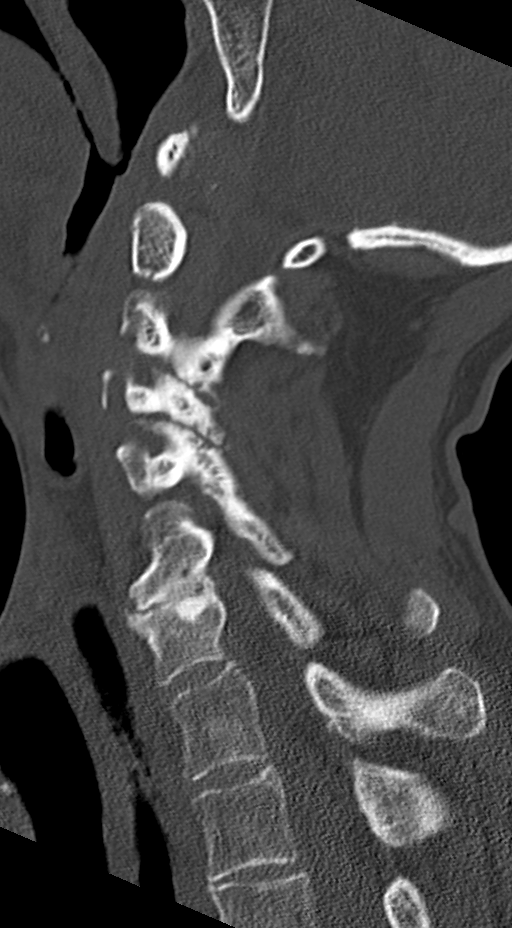
[im 35/53  bone]
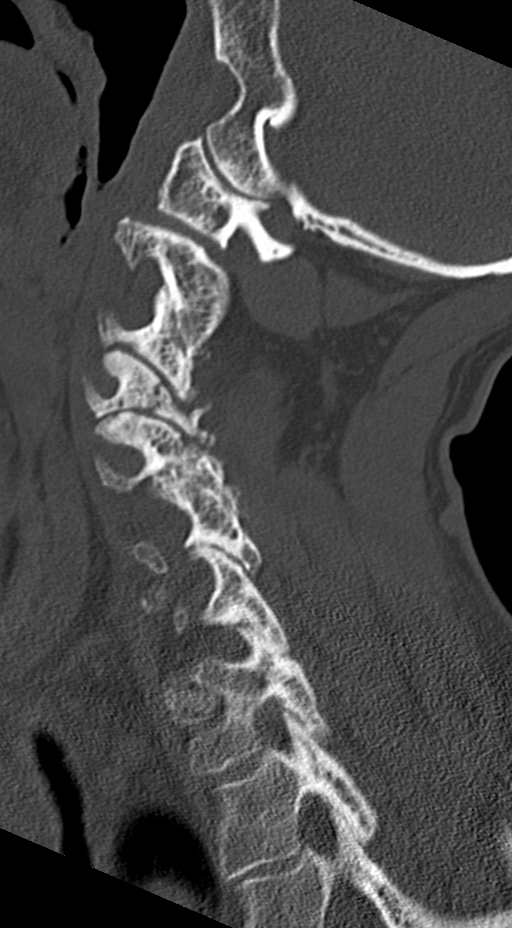

[11 of 33 positions shown; findings below may reference images not displayed]

FINDINGS: CT HEAD FINDINGS

Brain: No evidence of acute infarction, hemorrhage, hydrocephalus,
extra-axial collection or mass lesion/mass effect. Generalized
atrophy

Vascular: No hyperdense vessel or unexpected calcification.

Skull: Normal. Negative for fracture or focal lesion.

Sinuses/Orbits: Negative

CT CERVICAL SPINE FINDINGS

Alignment: Dextroscoliosis.

Skull base and vertebrae: No acute fracture

Soft tissues and spinal canal: No prevertebral fluid or swelling. No
visible canal hematoma. Laryngectomy and neck dissection with
transesophageal puncture.

Disc levels: Severe facet osteoarthritis with asymmetric bulky
spurring on the left. Generalized disc degeneration focally advanced
at C6-7.

Upper chest: Negative
IMPRESSION: No evidence of acute intracranial or cervical spine injury.
